# Patient Record
Sex: Female | Born: 1955 | Race: White | Hispanic: No | Marital: Married | Smoking: Former smoker
Health system: Southern US, Community
[De-identification: ages and names within clinical notes are randomized; demographics above are authoritative.]

## PROBLEM LIST (undated history)

## (undated) DIAGNOSIS — Z923 Personal history of irradiation: Secondary | ICD-10-CM

## (undated) DIAGNOSIS — Z803 Family history of malignant neoplasm of breast: Secondary | ICD-10-CM

## (undated) DIAGNOSIS — J189 Pneumonia, unspecified organism: Secondary | ICD-10-CM

## (undated) DIAGNOSIS — I10 Essential (primary) hypertension: Secondary | ICD-10-CM

## (undated) DIAGNOSIS — J45909 Unspecified asthma, uncomplicated: Secondary | ICD-10-CM

## (undated) DIAGNOSIS — C50919 Malignant neoplasm of unspecified site of unspecified female breast: Secondary | ICD-10-CM

## (undated) DIAGNOSIS — N6019 Diffuse cystic mastopathy of unspecified breast: Secondary | ICD-10-CM

## (undated) DIAGNOSIS — K219 Gastro-esophageal reflux disease without esophagitis: Secondary | ICD-10-CM

## (undated) DIAGNOSIS — Z800 Family history of malignant neoplasm of digestive organs: Secondary | ICD-10-CM

## (undated) DIAGNOSIS — E785 Hyperlipidemia, unspecified: Secondary | ICD-10-CM

## (undated) DIAGNOSIS — Z801 Family history of malignant neoplasm of trachea, bronchus and lung: Secondary | ICD-10-CM

## (undated) DIAGNOSIS — M8580 Other specified disorders of bone density and structure, unspecified site: Secondary | ICD-10-CM

## (undated) HISTORY — DX: Family history of malignant neoplasm of digestive organs: Z80.0

## (undated) HISTORY — PX: TONSILLECTOMY: SUR1361

## (undated) HISTORY — PX: BREAST CYST ASPIRATION: SHX578

## (undated) HISTORY — PX: TONSILLECTOMY AND ADENOIDECTOMY: SHX28

## (undated) HISTORY — DX: Family history of malignant neoplasm of trachea, bronchus and lung: Z80.1

## (undated) HISTORY — DX: Family history of malignant neoplasm of breast: Z80.3

## (undated) HISTORY — PX: TUBAL LIGATION: SHX77

---
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
## 2011-06-01 DIAGNOSIS — R739 Hyperglycemia, unspecified: Secondary | ICD-10-CM | POA: Insufficient documentation

## 2011-06-01 DIAGNOSIS — K219 Gastro-esophageal reflux disease without esophagitis: Secondary | ICD-10-CM | POA: Insufficient documentation

## 2011-06-01 DIAGNOSIS — G43009 Migraine without aura, not intractable, without status migrainosus: Secondary | ICD-10-CM | POA: Insufficient documentation

## 2015-03-05 DIAGNOSIS — I10 Essential (primary) hypertension: Secondary | ICD-10-CM | POA: Insufficient documentation

## 2015-04-05 DIAGNOSIS — K760 Fatty (change of) liver, not elsewhere classified: Secondary | ICD-10-CM | POA: Insufficient documentation

## 2015-11-18 ENCOUNTER — Encounter: Payer: Self-pay | Admitting: *Deleted

## 2015-11-18 ENCOUNTER — Ambulatory Visit (INDEPENDENT_AMBULATORY_CARE_PROVIDER_SITE_OTHER): Payer: BLUE CROSS/BLUE SHIELD

## 2015-11-18 ENCOUNTER — Ambulatory Visit
Admission: EM | Admit: 2015-11-18 | Discharge: 2015-11-18 | Disposition: A | Discharge status: Home / Self Care | Payer: BLUE CROSS/BLUE SHIELD | Attending: Family Medicine | Admitting: Family Medicine

## 2015-11-18 DIAGNOSIS — R059 Cough, unspecified: Secondary | ICD-10-CM

## 2015-11-18 DIAGNOSIS — R05 Cough: Secondary | ICD-10-CM

## 2015-11-18 HISTORY — DX: Hyperlipidemia, unspecified: E78.5

## 2015-11-18 HISTORY — DX: Essential (primary) hypertension: I10

## 2015-11-18 HISTORY — DX: Gastro-esophageal reflux disease without esophagitis: K21.9

## 2015-11-18 MED ORDER — IPRATROPIUM-ALBUTEROL 0.5-2.5 (3) MG/3ML IN SOLN
3.0000 mL | Freq: Once | RESPIRATORY_TRACT | Status: AC
  Administered 2015-11-18: 3 mL via RESPIRATORY_TRACT

## 2015-11-18 MED ORDER — FLUTICASONE PROPIONATE 50 MCG/ACT NA SUSP: 2.0000 | Freq: Every day | NASAL | Status: AC

## 2015-11-18 MED ORDER — BENZONATATE 100 MG PO CAPS: 100.0000 mg | ORAL_CAPSULE | Freq: Three times a day (TID) | ORAL | Status: DC | PRN

## 2015-11-18 MED ORDER — ALBUTEROL SULFATE HFA 108 (90 BASE) MCG/ACT IN AERS: 1.0000 | INHALATION_SPRAY | Freq: Four times a day (QID) | RESPIRATORY_TRACT | Status: AC | PRN

## 2015-11-18 NOTE — ED Provider Notes (Signed)
CSN: [id number]     Arrival date & time 11/18/15  1046 History   First MD Initiated Contact with Patient 11/18/15 1306     Chief Complaint  Patient presents with  . Sore Throat  . Cough  . Nasal Congestion   (Consider location/radiation/quality/duration/timing/severity/associated sxs/prior Treatment) HPI 60 yo WF with increasing cough and nasal congestion x 4 days. Uses Zyrtec 1 QD and has continued. Non-smoker. Works at retirement home [employer] and not allowed to go in today reporting morning Temp at 100.1 Wheeze. Small amount mucous production. No hx asthma. Using herbals, elderberry,and tylenol without relief. Remote smoking history  Past Medical History  Diagnosis Date  . Hypertension   . Hyperlipemia   . GERD (gastroesophageal reflux disease)    Past Surgical History  Procedure Laterality Date  . Tubal ligation    . Tonsillectomy and adenoidectomy    . Tonsillectomy     Family History  Problem Relation Age of Onset  . Hypertension Mother   . Cancer Mother   . Diabetes Mother   . Hypertension Father    Social History  Substance Use Topics  . Smoking status: Former Research scientist (life sciences)  . Smokeless tobacco: Never Used  . Alcohol Use: No   OB History    No data available     Review of Systems Constitutional: Low grade fever. No headache. Eyes: No visual changes. [id number] sore throat. Cardiovascular:Negative for chest pain/palpitations Respiratory: Negative for shortness of breath. Positive mild wheeze bilat; [id number] Gastrointestinal: No abdominal pain. No nausea,vomiting, diarrhea Genitourinary: Negative for dysuria. Normal urination. Musculoskeletal: Negative for back pain. FROM extremities without pain Skin: Negative for rash Neurological: Negative for headache, focal weakness or numbness  Allergies  Latex  Home Medications   Prior to Admission medications   Medication Sig Start Date End Date Taking? Authorizing Provider  cetirizine (ZYRTEC) 10 MG tablet Take  10 mg by mouth daily.   Yes Historical Provider, MD  co-enzyme Q-10 50 MG capsule Take 100 mg by mouth daily.   Yes Historical Provider, MD  hydrochlorothiazide (MICROZIDE) 12.5 MG capsule Take 12.5 mg by mouth daily.   Yes Historical Provider, MD  potassium chloride SA (K-DUR,KLOR-CON) 20 MEQ tablet Take 20 mEq by mouth daily.   Yes Historical Provider, MD  ranitidine (ZANTAC) 150 MG tablet Take 150 mg by mouth daily.   Yes Historical Provider, MD  simvastatin (ZOCOR) 40 MG tablet Take 40 mg by mouth daily.   Yes Historical Provider, MD  albuterol (PROVENTIL HFA;VENTOLIN HFA) 108 (90 Base) MCG/ACT inhaler Inhale 1-2 puffs into the lungs every 6 (six) hours as needed for wheezing or shortness of breath. 11/18/15   Jan Fireman, PA-C  benzonatate (TESSALON) 100 MG capsule Take 1 capsule (100 mg total) by mouth 3 (three) times daily as needed. 11/18/15   Jan Fireman, PA-C  fluticasone [facility]) 50 MCG/ACT nasal spray Place 2 sprays into both nostrils daily. 11/18/15   Jan Fireman, PA-C   Meds Ordered and Administered this Visit   Medications  ipratropium-albuterol (DUONEB) 0.5-2.5 (3) MG/3ML nebulizer solution 3 mL (3 mLs Nebulization Given 11/18/15 1319)  eased cough but residual wheeze persisted- add albuterol, increase fluids  BP 108/70 mmHg  Pulse 107  Temp(Src) 98.4 F (36.9 C) (Oral)  Resp 22  Ht 5\' 3"  (1.6 m)  Wt 208 lb (94.348 kg)  BMI 36.85 kg/m2  SpO2 99%  LMP  No data found.   Physical Exam.   VS noted, WNL GENERAL : NAD,  cough repetitive non-productive, afebrile HEENT: no pharyngeal erythema,no exudate, no erythema of TMs, clear canals, no retraction, no cervical LAD RESP: CTA  B , bilateral  wheezing, no accessory muscle use, Wheeze decreased but persists after [id number] 98 CARD: RRR ABD: Not distended NEURO: Good attention, good recall, no gross neuro defecit PSYCH: speech and behavior appropriate  ED Course  Procedures (including critical care time)  Labs  Review Labs Reviewed - No data to display  Imaging Review No results found. No results found for this or any previous visit.  CXR reported as negative for cardio-pulmonary disease Discussed with patient   MDM   1. Cough    Plan: Test/x-ray results and diagnosis reviewed with patient  Treat cough- viral tussive issues reviewed-increase Zyrtec to BID for a few days Rx as per orders;  benefits, risks, potential side effects reviewed   Recommend supportive treatment with cyclic tylenol and ibuprofen, albuterol, tessalon Seek additional medical care if symptoms worsen or are not improving  Discharge Medication List as of 11/18/2015  2:13 PM    START taking these medications   Details  albuterol (PROVENTIL HFA;VENTOLIN HFA) 108 (90 Base) MCG/ACT inhaler Inhale 1-2 puffs into the lungs every 6 (six) hours as needed for wheezing or shortness of breath., Starting 11/18/2015, Until Discontinued, Normal    benzonatate (TESSALON) 100 MG capsule Take 1 capsule (100 mg total) by mouth 3 (three) times daily as needed., Starting 11/18/2015, Until Discontinued, Normal    fluticasone (FLONASE) 50 MCG/ACT nasal spray Place 2 sprays into both nostrils daily., Starting 11/18/2015, Until Discontinued, Normal           Jan Fireman, PA-C 11/22/15 2220

## 2015-11-18 NOTE — ED Notes (Signed)
Patient started having symptoms of sore throat and cough last Thursday which progressed to nasal congestion and drainage on Friday. Symptoms have progressively worsened with fever symptoms occuring today. Patient has a history of pneumonia.

## 2015-11-18 NOTE — Discharge Instructions (Signed)
Add back Zyrtec 10  Mg 1 twice daily for a few days, the one a day Benzonatate for cough as dorected Inhaler as discusses for cough Fluticasone nasal spray 1 per nostril twice daily for 3 days then reduce to daily   [employer] may help relieve the symptoms of a cough and cold. They add moisture to the air, which helps mucus to become thinner and less sticky. This makes it easier to breathe and cough up secretions. Cool mist vaporizers do not cause serious burns like hot mist vaporizers, which may also be called steamers or humidifiers. Vaporizers have not been proven to help with colds. You should not use a vaporizer if you are allergic to mold. HOME CARE INSTRUCTIONS  Follow the package instructions for the vaporizer.  Do not use anything other than distilled water in the vaporizer.  Do not run the vaporizer all of the time. This can cause mold or bacteria to grow in the vaporizer.  Clean the vaporizer after each time it is used.  Clean and dry the vaporizer well before storing it.  Stop using the vaporizer if worsening respiratory symptoms develop.   This information is not intended to replace advice given to you by your health care provider. Make sure you discuss any questions you have with your health care provider.   Document Released: 07/16/2004 Document Revised: 10/24/2013 Document Reviewed: 03/08/2013 Elsevier Interactive Patient Education 2016 Elsevier Inc.  Cough, Adult A cough helps to clear your throat and lungs. A cough may last only 2-3 weeks (acute), or it may last longer than 8 weeks (chronic). Many different things can cause a cough. A cough may be a sign of an illness or another medical condition. HOME CARE  Pay attention to any changes in your cough.  Take medicines only as told by your doctor.  If you were prescribed an antibiotic medicine, take it as told by your doctor. Do not stop taking it even if you start to feel better.  Talk with  your doctor before you try using a cough medicine.  Drink enough fluid to keep your pee (urine) clear or pale yellow.  If the air is dry, use a cold steam vaporizer or humidifier in your home.  Stay away from things that make you cough at work or at home.  If your cough is worse at night, try using extra pillows to raise your head up higher while you sleep.  Do not smoke, and try not to be around smoke. If you need help quitting, ask your doctor.  Do not have caffeine.  Do not drink alcohol.  Rest as needed. GET HELP IF:  You have new problems (symptoms).  You cough up yellow fluid (pus).  Your cough does not get better after 2-3 weeks, or your cough gets worse.  Medicine does not help your cough and you are not sleeping well.  You have pain that gets worse or pain that is not helped with medicine.  You have a fever.  You are losing weight and you do not know why.  You have night sweats. GET HELP RIGHT AWAY IF:  You cough up blood.  You have trouble breathing.  Your heartbeat is very fast.   This information is not intended to replace advice given to you by your health care provider. Make sure you discuss any questions you have with your health care provider.   Document Released: 07/02/2011 Document Revised: 07/10/2015 Document Reviewed: 12/26/2014 Elsevier Interactive Patient Education  Education ©2016 Elsevier Inc. ° °

## 2015-11-22 ENCOUNTER — Encounter: Payer: Self-pay | Admitting: Physician Assistant

## 2016-01-13 DIAGNOSIS — R05 Cough: Secondary | ICD-10-CM | POA: Insufficient documentation

## 2016-01-13 DIAGNOSIS — J302 Other seasonal allergic rhinitis: Secondary | ICD-10-CM | POA: Insufficient documentation

## 2016-01-13 DIAGNOSIS — R053 Chronic cough: Secondary | ICD-10-CM | POA: Insufficient documentation

## 2016-01-13 DIAGNOSIS — H1013 Acute atopic conjunctivitis, bilateral: Secondary | ICD-10-CM | POA: Insufficient documentation

## 2017-03-15 DIAGNOSIS — E7800 Pure hypercholesterolemia, unspecified: Secondary | ICD-10-CM | POA: Insufficient documentation

## 2019-04-11 ENCOUNTER — Other Ambulatory Visit: Payer: Self-pay | Admitting: Family Medicine

## 2019-04-11 DIAGNOSIS — Z1231 Encounter for screening mammogram for malignant neoplasm of breast: Secondary | ICD-10-CM

## 2019-04-19 ENCOUNTER — Ambulatory Visit
Admission: RE | Admit: 2019-04-19 | Discharge: 2019-04-19 | Disposition: A | Discharge status: Home / Self Care | Payer: BC Managed Care – PPO | Source: Ambulatory Visit | Attending: Family Medicine | Admitting: Family Medicine

## 2019-04-19 ENCOUNTER — Encounter (INDEPENDENT_AMBULATORY_CARE_PROVIDER_SITE_OTHER): Payer: Self-pay

## 2019-04-19 ENCOUNTER — Other Ambulatory Visit: Payer: Self-pay

## 2019-04-19 DIAGNOSIS — Z1231 Encounter for screening mammogram for malignant neoplasm of breast: Secondary | ICD-10-CM | POA: Insufficient documentation

## 2019-04-19 HISTORY — DX: Diffuse cystic mastopathy of unspecified breast: N60.19

## 2019-04-24 ENCOUNTER — Other Ambulatory Visit: Payer: Self-pay | Admitting: Family Medicine

## 2019-04-24 DIAGNOSIS — R921 Mammographic calcification found on diagnostic imaging of breast: Secondary | ICD-10-CM

## 2019-04-24 DIAGNOSIS — R928 Other abnormal and inconclusive findings on diagnostic imaging of breast: Secondary | ICD-10-CM

## 2019-04-28 ENCOUNTER — Ambulatory Visit
Admission: RE | Admit: 2019-04-28 | Discharge: 2019-04-28 | Disposition: A | Discharge status: Home / Self Care | Payer: BC Managed Care – PPO | Source: Ambulatory Visit | Attending: Family Medicine | Admitting: Family Medicine

## 2019-04-28 ENCOUNTER — Other Ambulatory Visit: Payer: Self-pay | Admitting: Family Medicine

## 2019-04-28 ENCOUNTER — Other Ambulatory Visit: Payer: Self-pay

## 2019-04-28 DIAGNOSIS — R928 Other abnormal and inconclusive findings on diagnostic imaging of breast: Secondary | ICD-10-CM

## 2019-04-28 DIAGNOSIS — R921 Mammographic calcification found on diagnostic imaging of breast: Secondary | ICD-10-CM

## 2019-05-01 ENCOUNTER — Other Ambulatory Visit: Payer: Self-pay | Admitting: Family Medicine

## 2019-05-01 DIAGNOSIS — R921 Mammographic calcification found on diagnostic imaging of breast: Secondary | ICD-10-CM

## 2019-05-01 DIAGNOSIS — R928 Other abnormal and inconclusive findings on diagnostic imaging of breast: Secondary | ICD-10-CM

## 2019-05-02 ENCOUNTER — Ambulatory Visit
Admission: RE | Admit: 2019-05-02 | Discharge: 2019-05-02 | Disposition: A | Discharge status: Home / Self Care | Payer: BC Managed Care – PPO | Source: Ambulatory Visit | Attending: Family Medicine | Admitting: Family Medicine

## 2019-05-02 ENCOUNTER — Other Ambulatory Visit: Payer: Self-pay

## 2019-05-02 DIAGNOSIS — R921 Mammographic calcification found on diagnostic imaging of breast: Secondary | ICD-10-CM | POA: Diagnosis present

## 2019-05-02 DIAGNOSIS — R928 Other abnormal and inconclusive findings on diagnostic imaging of breast: Secondary | ICD-10-CM

## 2019-05-02 HISTORY — PX: BREAST BIOPSY: SHX20

## 2019-05-04 ENCOUNTER — Encounter: Payer: Self-pay | Admitting: *Deleted

## 2019-05-04 DIAGNOSIS — D0511 Intraductal carcinoma in situ of right breast: Secondary | ICD-10-CM

## 2019-05-04 NOTE — Progress Notes (Signed)
Called patient today to establish navigation services.  Patient is newly diagnosed with DCIS of the right breast.  She was notified of her diagnosis by Ermalene Searing at [facility] radiology and informed navigation would assist with coordination of care.  Patient is affilated with [facility] through her primary care provider Dr Hoy Morn, but would like to have her surgery and treatment close to home.  I have scheduled her to see Dr. Peyton Najjar at [facility] and Dr. Tasia Catchings at the cancer center.  I left patient a message with her appointment and requested she return my call to confirm.  She was encouraged to call with any questions of needs.

## 2019-05-08 ENCOUNTER — Other Ambulatory Visit: Payer: Self-pay | Admitting: Pathology

## 2019-05-08 LAB — SURGICAL PATHOLOGY

## 2019-05-09 ENCOUNTER — Ambulatory Visit: Payer: Self-pay | Admitting: General Surgery

## 2019-05-09 NOTE — H&P (Signed)
PATIENT PROFILE: Jane Gardner is a 63 y.o. female who presents to the Clinic for consultation at the request of Dr. Hoy Gardner for evaluation of Breast Cancer.  PCP:  Jane Chyle, MD  HISTORY OF PRESENT ILLNESS: Jane Gardner reports he had her screening mammogram and she was found with a suspicious lesion.  She underwent diagnostic mammogram and ultrasound which shows a linear calcifications.  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  Patient denied any breast mass, any skin changes or any nipple retraction or discharge.  Family history of breast cancer: Mother with breast cancer Family history of other cancers: Aunt with pancreatic cancer, uncle with lung cancer. Menarche: 47 years old Menopause: In her 22s Used OCP: 14 years Used estrogen and progesterone therapy: Premarin History of Radiation to the chest: None No previous breast biopsy.  PROBLEM LIST:         Problem List  Date Reviewed: 12/09/2017         Noted   Hypercholesterolemia 03/15/2017   Seasonal allergic rhinitis 01/13/2016   Allergic conjunctivitis of both eyes 01/13/2016   Chronic cough 01/13/2016   Fatty liver: has completed Hep A and B series 04/05/2015   Benign essential hypertension 03/05/2015   Migraine without aura and without status migrainosus, not intractable 06/01/2011   GERD (gastroesophageal reflux disease) 06/01/2011   Hyperglycemia 06/01/2011      GENERAL REVIEW OF SYSTEMS:   General ROS: negative for - chills, fatigue, fever, weight gain or weight loss Allergy and Immunology ROS: negative for - hives  Hematological and Lymphatic ROS: negative for - bleeding problems or bruising, negative for palpable nodes Endocrine ROS: negative for - heat or cold intolerance, hair changes Respiratory ROS: negative for - cough, shortness of breath or wheezing Cardiovascular ROS: no chest pain or palpitations GI ROS: negative for nausea,  vomiting, abdominal pain, diarrhea, constipation Musculoskeletal ROS: negative for - joint swelling or muscle pain Neurological ROS: negative for - confusion, syncope Dermatological ROS: negative for pruritus and rash Psychiatric: negative for anxiety, depression, difficulty sleeping and memory loss  MEDICATIONS: CurrentMedications        Current Outpatient Medications  Medication Sig Dispense Refill  . ascorbic acid, vitamin C, (VITAMIN C) 1000 MG tablet Take 100 mg by mouth once daily       . ascorbic acid/collagen hydr (COLLAGEN PLUS VITAMIN C ORAL) Take 2,500 mg by mouth 3 (three) times daily    . atorvastatin (LIPITOR) 40 MG tablet Take 1 tablet (40 mg total) by mouth once daily 90 tablet 3  . BIOTIN ORAL Take by mouth.    . cetirizine (ZYRTEC) 10 mg capsule 1 tab by mouth daily    . cholecalciferol (VITAMIN D3) 1,000 unit capsule Take 1,000 Units by mouth every other day      . coenzyme Q10 10 mg capsule Take 100 mg by mouth once daily.      Marland Kitchen ELDERBERRY FRUIT ORAL Take by mouth w/ zinc & vitamins - 2 daily    . famotidine (PEPCID) 20 MG tablet Take 20 mg by mouth 2 (two) times daily    . fluticasone (FLONASE) 50 mcg/actuation nasal spray 2 sprays by Nasal route once daily       . glucosamine/msm/csa/cal/hrb115 (GLUCOSAMIN-CHOND-MSM-CAL-115HC ORAL) Take by mouth    . lisinopriL (ZESTRIL) 10 MG tablet Take 1 tablet (10 mg total) by mouth once daily 90 tablet 4  . potassium chloride (KLOR-CON) 10 MEQ ER  tablet Take 2 tablets (20 mEq total) by mouth once daily 180 tablet 3   No current facility-administered medications for this visit.       ALLERGIES: Latex and Levsin [hyoscyamine sulfate]  PAST MEDICAL HISTORY:     Past Medical History:  Diagnosis Date  . Allergic conjunctivitis of both eyes 01/13/2016  . Arthritis 2018   right knee  . Chronic urticaria    secondary to latex/elastic exposure  . Depression 1987  . Dyspepsia   . Fatty liver  - Hep B completed.  1st Hep A 04/05/15 04/05/2015  . GERD (gastroesophageal reflux disease)   . Hypercholesterolemia 03/15/2017  . Hyperglycemia   . Hyperlipidemia   . Hypertension 10.20.09  . Migraines    two per month  . Morbid obesity (CMS-HCC)   . Plantar fasciitis   . Seasonal allergic rhinitis 01/13/2016  . Uterine fibroid july 2000   vs adenomyosis on ultrasound,endometrial biopsy negative    PAST SURGICAL HISTORY:      Past Surgical History:  Procedure Laterality Date  . TONSILLECTOMY    . TUBAL LIGATION  1984     FAMILY HISTORY:      Family History  Problem Relation Age of Onset  . High blood pressure (Hypertension) Mother   . Breast cancer Mother   . Hyperlipidemia (Elevated cholesterol) Mother   . Diabetes type II Father   . Deep vein thrombosis (DVT or abnormal blood clot formation) Father   . Diabetes Father   . Hyperlipidemia (Elevated cholesterol) Sister   . Stroke Maternal Grandmother   . Diabetes type II Paternal Grandmother   . Diabetes Paternal Grandmother   . Obesity Son   . High blood pressure (Hypertension) Son   . Asthma Son   . Obesity Son   . No Known Problems Daughter   . Alcohol abuse Paternal Grandfather      SOCIAL HISTORY: Social History          Socioeconomic History  . Marital status: Married    Spouse name: Not on file  . Number of children: Not on file  . Years of education: Not on file  . Highest education level: Not on file  Occupational History  . Not on file  Social Needs  . Financial resource strain: Not on file  . Food insecurity:    Worry: Not on file    Inability: Not on file  . Transportation needs:    Medical: Not on file    Non-medical: Not on file  Tobacco Use  . Smoking status: Former Smoker    Packs/day: 2.00    Years: 3.00    Pack years: 6.00    Types: Cigarettes    Last attempt to quit: 11/03/1975    Years since quitting: 43.5  . Smokeless tobacco:  Never Used  Substance and Sexual Activity  . Alcohol use: Yes    Alcohol/week: 0.0 standard drinks    Comment: 1 glass wine 1-2 times a month.  . Drug use: No  . Sexual activity: Yes    Partners: Male    Birth control/protection: Surgical  Other Topics Concern  . Not on file  Social History Narrative   She lives with her husband, married 02/17/1975, three grown children. 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   Exercise: active at work.    Wears seatbelt, sunscreen.  Dentist: yes.  Calcium in diet: yes.  No religious beliefs affecting  health care.        Advanced Directives: yes.  Has a living will and HCPOA.        PHYSICAL EXAM:    Vitals:   05/09/19 1528  BP: 111/74  Pulse: 84   Body mass index is 38.97 kg/m. Weight: 99.8 kg (220 lb)   GENERAL: Alert, active, oriented x3  HEENT: Pupils equal reactive to light. Extraocular movements are intact. Sclera clear. Palpebral conjunctiva normal red color.Pharynx clear.  NECK: Supple with no palpable mass and no adenopathy.  LUNGS: Sound clear with no rales rhonchi or wheezes.  HEART: Regular rhythm S1 and S2 without murmur.  BREAST: Both breasts are examined in the supine and sitting position.  There was no palpable mass, no skin changes, no nipple retraction.  There was no axillary adenopathy bilaterally.  ABDOMEN: Soft and depressible, nontender with no palpable mass, no hepatomegaly.  EXTREMITIES: Well-developed well-nourished symmetrical with no dependent edema.  NEUROLOGICAL: Awake alert oriented, facial expression symmetrical, moving all extremities.  REVIEW OF DATA: I have reviewed the following data today:      Office Visit on 04/06/2019  Component Date Value  . Sodium 04/20/2019 143   . Potassium 04/20/2019 4.3   . Chloride 04/20/2019 111*  . Carbon Dioxide (CO2) 04/20/2019 25   . Urea Nitrogen (BUN) 04/20/2019 16   . Creatinine  04/20/2019 0.9   . Glucose 04/20/2019 91   . Calcium 04/20/2019 8.8   . AST (Aspartate Aminotran* 04/20/2019 23   . ALT (Alanine Aminotransf* 04/20/2019 25   . Bilirubin, Total 04/20/2019 0.6   . Alk Phos (Alkaline Phosp* 04/20/2019 78   . Albumin 04/20/2019 3.6   . Protein, Total 04/20/2019 6.3   . Anion Gap 04/20/2019 7   . BUN/CREA Ratio 04/20/2019 18   . Glomerular Filtration Ra* 04/20/2019 67   . Cholesterol, Total 04/20/2019 137   . LDL Calculated 04/20/2019 69   . HDL 04/20/2019 42   . Triglyceride 04/20/2019 128   . Hemoglobin A1C 04/06/2019 5.6   . Average Blood Glucose (C* 04/06/2019 111      ASSESSMENT: Jane Gardner is a 63 y.o. female presenting for consultation for right breast ductal carcinoma in situ.    Patient was oriented again about the pathology results. Surgical alternatives were discussed with patient including partial vs total mastectomy. Surgical technique and post operative care was discussed with patient. Risk of surgery was discussed with patient including but not limited to: wound infection, seroma, hematoma, brachial plexopathy, mondor's disease (thrombosis of small veins of breast), chronic wound pain, breast lymphedema, altered sensation to the nipple and cosmesis among others.   Patient has DCIS with comedonecrosis.  Slight increase in upgrading the DCIS to invasive breast cancer.  Discussed with her the alternative of doing a sentinel node biopsy on the surgery also because the lesion is in the upper outer quadrant that can make doing a sentinel needle biopsy of the right axilla later if she comes back positive for invasive carcinoma.  In that case she will need a axillary dissection.  With the recommendation patient prefers to have the sentinel node biopsy at this moment.  Malignant neoplasm of upper-outer quadrant of right breast in female, estrogen receptor positive (CMS-HCC) [C50.411, Z17.0]  PLAN: 1.  Needle guided partial mastectomy with  sentinel node biopsy of the right breast (19301, 38525) 2.  Avoid taking aspirin 5 days before surgery 3.  Contact office if you have any concern or question.  Patient verbalized  understanding, all questions were answered, and were agreeable with the plan outlined above.     Herbert Pun, MD  Electronically signed by Herbert Pun, MD

## 2019-05-09 NOTE — H&P (View-Only) (Signed)
PATIENT PROFILE: Tatisha Cerino is a 63 y.o. female who presents to the Clinic for consultation at the request of Dr. Hoy Morn for evaluation of Breast Cancer.  PCP:  Dimas Chyle, MD  HISTORY OF PRESENT ILLNESS: Ms. Koffler reports he had her screening mammogram and she was found with a suspicious lesion.  She underwent diagnostic mammogram and ultrasound which shows a linear calcifications.  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  Patient denied any breast mass, any skin changes or any nipple retraction or discharge.  Family history of breast cancer: Mother with breast cancer Family history of other cancers: Aunt with pancreatic cancer, uncle with lung cancer. Menarche: 45 years old Menopause: In her 64s Used OCP: 14 years Used estrogen and progesterone therapy: Premarin History of Radiation to the chest: None No previous breast biopsy.  PROBLEM LIST:         Problem List  Date Reviewed: 12/09/2017         Noted   Hypercholesterolemia 03/15/2017   Seasonal allergic rhinitis 01/13/2016   Allergic conjunctivitis of both eyes 01/13/2016   Chronic cough 01/13/2016   Fatty liver: has completed Hep A and B series 04/05/2015   Benign essential hypertension 03/05/2015   Migraine without aura and without status migrainosus, not intractable 06/01/2011   GERD (gastroesophageal reflux disease) 06/01/2011   Hyperglycemia 06/01/2011      GENERAL REVIEW OF SYSTEMS:   General ROS: negative for - chills, fatigue, fever, weight gain or weight loss Allergy and Immunology ROS: negative for - hives  Hematological and Lymphatic ROS: negative for - bleeding problems or bruising, negative for palpable nodes Endocrine ROS: negative for - heat or cold intolerance, hair changes Respiratory ROS: negative for - cough, shortness of breath or wheezing Cardiovascular ROS: no chest pain or palpitations GI ROS: negative for nausea,  vomiting, abdominal pain, diarrhea, constipation Musculoskeletal ROS: negative for - joint swelling or muscle pain Neurological ROS: negative for - confusion, syncope Dermatological ROS: negative for pruritus and rash Psychiatric: negative for anxiety, depression, difficulty sleeping and memory loss  MEDICATIONS: CurrentMedications        Current Outpatient Medications  Medication Sig Dispense Refill  . ascorbic acid, vitamin C, (VITAMIN C) 1000 MG tablet Take 100 mg by mouth once daily       . ascorbic acid/collagen hydr (COLLAGEN PLUS VITAMIN C ORAL) Take 2,500 mg by mouth 3 (three) times daily    . atorvastatin (LIPITOR) 40 MG tablet Take 1 tablet (40 mg total) by mouth once daily 90 tablet 3  . BIOTIN ORAL Take by mouth.    . cetirizine (ZYRTEC) 10 mg capsule 1 tab by mouth daily    . cholecalciferol (VITAMIN D3) 1,000 unit capsule Take 1,000 Units by mouth every other day      . coenzyme Q10 10 mg capsule Take 100 mg by mouth once daily.      Marland Kitchen ELDERBERRY FRUIT ORAL Take by mouth w/ zinc & vitamins - 2 daily    . famotidine (PEPCID) 20 MG tablet Take 20 mg by mouth 2 (two) times daily    . fluticasone (FLONASE) 50 mcg/actuation nasal spray 2 sprays by Nasal route once daily       . glucosamine/msm/csa/cal/hrb115 (GLUCOSAMIN-CHOND-MSM-CAL-115HC ORAL) Take by mouth    . lisinopriL (ZESTRIL) 10 MG tablet Take 1 tablet (10 mg total) by mouth once daily 90 tablet 4  . potassium chloride (KLOR-CON) 10 MEQ ER  tablet Take 2 tablets (20 mEq total) by mouth once daily 180 tablet 3   No current facility-administered medications for this visit.       ALLERGIES: Latex and Levsin [hyoscyamine sulfate]  PAST MEDICAL HISTORY:     Past Medical History:  Diagnosis Date  . Allergic conjunctivitis of both eyes 01/13/2016  . Arthritis 2018   right knee  . Chronic urticaria    secondary to latex/elastic exposure  . Depression 1987  . Dyspepsia   . Fatty liver  - Hep B completed.  1st Hep A 04/05/15 04/05/2015  . GERD (gastroesophageal reflux disease)   . Hypercholesterolemia 03/15/2017  . Hyperglycemia   . Hyperlipidemia   . Hypertension 10.20.09  . Migraines    two per month  . Morbid obesity (CMS-HCC)   . Plantar fasciitis   . Seasonal allergic rhinitis 01/13/2016  . Uterine fibroid july 2000   vs adenomyosis on ultrasound,endometrial biopsy negative    PAST SURGICAL HISTORY:      Past Surgical History:  Procedure Laterality Date  . TONSILLECTOMY    . TUBAL LIGATION  1984     FAMILY HISTORY:      Family History  Problem Relation Age of Onset  . High blood pressure (Hypertension) Mother   . Breast cancer Mother   . Hyperlipidemia (Elevated cholesterol) Mother   . Diabetes type II Father   . Deep vein thrombosis (DVT or abnormal blood clot formation) Father   . Diabetes Father   . Hyperlipidemia (Elevated cholesterol) Sister   . Stroke Maternal Grandmother   . Diabetes type II Paternal Grandmother   . Diabetes Paternal Grandmother   . Obesity Son   . High blood pressure (Hypertension) Son   . Asthma Son   . Obesity Son   . No Known Problems Daughter   . Alcohol abuse Paternal Grandfather      SOCIAL HISTORY: Social History          Socioeconomic History  . Marital status: Married    Spouse name: Not on file  . Number of children: Not on file  . Years of education: Not on file  . Highest education level: Not on file  Occupational History  . Not on file  Social Needs  . Financial resource strain: Not on file  . Food insecurity:    Worry: Not on file    Inability: Not on file  . Transportation needs:    Medical: Not on file    Non-medical: Not on file  Tobacco Use  . Smoking status: Former Smoker    Packs/day: 2.00    Years: 3.00    Pack years: 6.00    Types: Cigarettes    Last attempt to quit: 11/03/1975    Years since quitting: 43.5  . Smokeless tobacco:  Never Used  Substance and Sexual Activity  . Alcohol use: Yes    Alcohol/week: 0.0 standard drinks    Comment: 1 glass wine 1-2 times a month.  . Drug use: No  . Sexual activity: Yes    Partners: Male    Birth control/protection: Surgical  Other Topics Concern  . Not on file  Social History Narrative   She lives with her husband, married 02/17/1975, three grown children. 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   Exercise: active at work.    Wears seatbelt, sunscreen.  Dentist: yes.  Calcium in diet: yes.  No religious beliefs affecting  health care.        Advanced Directives: yes.  Has a living will and HCPOA.        PHYSICAL EXAM:    Vitals:   05/09/19 1528  BP: 111/74  Pulse: 84   Body mass index is 38.97 kg/m. Weight: 99.8 kg (220 lb)   GENERAL: Alert, active, oriented x3  HEENT: Pupils equal reactive to light. Extraocular movements are intact. Sclera clear. Palpebral conjunctiva normal red color.Pharynx clear.  NECK: Supple with no palpable mass and no adenopathy.  LUNGS: Sound clear with no rales rhonchi or wheezes.  HEART: Regular rhythm S1 and S2 without murmur.  BREAST: Both breasts are examined in the supine and sitting position.  There was no palpable mass, no skin changes, no nipple retraction.  There was no axillary adenopathy bilaterally.  ABDOMEN: Soft and depressible, nontender with no palpable mass, no hepatomegaly.  EXTREMITIES: Well-developed well-nourished symmetrical with no dependent edema.  NEUROLOGICAL: Awake alert oriented, facial expression symmetrical, moving all extremities.  REVIEW OF DATA: I have reviewed the following data today:      Office Visit on 04/06/2019  Component Date Value  . Sodium 04/20/2019 143   . Potassium 04/20/2019 4.3   . Chloride 04/20/2019 111*  . Carbon Dioxide (CO2) 04/20/2019 25   . Urea Nitrogen (BUN) 04/20/2019 16   . Creatinine  04/20/2019 0.9   . Glucose 04/20/2019 91   . Calcium 04/20/2019 8.8   . AST (Aspartate Aminotran* 04/20/2019 23   . ALT (Alanine Aminotransf* 04/20/2019 25   . Bilirubin, Total 04/20/2019 0.6   . Alk Phos (Alkaline Phosp* 04/20/2019 78   . Albumin 04/20/2019 3.6   . Protein, Total 04/20/2019 6.3   . Anion Gap 04/20/2019 7   . BUN/CREA Ratio 04/20/2019 18   . Glomerular Filtration Ra* 04/20/2019 67   . Cholesterol, Total 04/20/2019 137   . LDL Calculated 04/20/2019 69   . HDL 04/20/2019 42   . Triglyceride 04/20/2019 128   . Hemoglobin A1C 04/06/2019 5.6   . Average Blood Glucose (C* 04/06/2019 111      ASSESSMENT: Ms. Deguzman is a 63 y.o. female presenting for consultation for right breast ductal carcinoma in situ.    Patient was oriented again about the pathology results. Surgical alternatives were discussed with patient including partial vs total mastectomy. Surgical technique and post operative care was discussed with patient. Risk of surgery was discussed with patient including but not limited to: wound infection, seroma, hematoma, brachial plexopathy, mondor's disease (thrombosis of small veins of breast), chronic wound pain, breast lymphedema, altered sensation to the nipple and cosmesis among others.   Patient has DCIS with comedonecrosis.  Slight increase in upgrading the DCIS to invasive breast cancer.  Discussed with her the alternative of doing a sentinel node biopsy on the surgery also because the lesion is in the upper outer quadrant that can make doing a sentinel needle biopsy of the right axilla later if she comes back positive for invasive carcinoma.  In that case she will need a axillary dissection.  With the recommendation patient prefers to have the sentinel node biopsy at this moment.  Malignant neoplasm of upper-outer quadrant of right breast in female, estrogen receptor positive (CMS-HCC) [C50.411, Z17.0]  PLAN: 1.  Needle guided partial mastectomy with  sentinel node biopsy of the right breast (19301, 38525) 2.  Avoid taking aspirin 5 days before surgery 3.  Contact office if you have any concern or question.  Patient verbalized  understanding, all questions were answered, and were agreeable with the plan outlined above.     Herbert Pun, MD  Electronically signed by Herbert Pun, MD

## 2019-05-10 ENCOUNTER — Encounter: Payer: Self-pay | Admitting: Oncology

## 2019-05-10 ENCOUNTER — Other Ambulatory Visit: Payer: Self-pay

## 2019-05-10 ENCOUNTER — Inpatient Hospital Stay: Payer: BC Managed Care – PPO | Attending: Oncology | Admitting: Oncology

## 2019-05-10 ENCOUNTER — Other Ambulatory Visit: Payer: Self-pay | Admitting: General Surgery

## 2019-05-10 VITALS — BP 115/82 | HR 73 | Temp 96.3°F | Resp 18 | Ht 63.0 in | Wt 223.2 lb

## 2019-05-10 DIAGNOSIS — Z170 Estrogen receptor positive status [ER+]: Secondary | ICD-10-CM | POA: Diagnosis not present

## 2019-05-10 DIAGNOSIS — Z803 Family history of malignant neoplasm of breast: Secondary | ICD-10-CM | POA: Diagnosis not present

## 2019-05-10 DIAGNOSIS — Z809 Family history of malignant neoplasm, unspecified: Secondary | ICD-10-CM

## 2019-05-10 DIAGNOSIS — C50411 Malignant neoplasm of upper-outer quadrant of right female breast: Secondary | ICD-10-CM

## 2019-05-10 DIAGNOSIS — D0511 Intraductal carcinoma in situ of right breast: Secondary | ICD-10-CM | POA: Diagnosis not present

## 2019-05-10 DIAGNOSIS — Z800 Family history of malignant neoplasm of digestive organs: Secondary | ICD-10-CM | POA: Diagnosis not present

## 2019-05-10 NOTE — Progress Notes (Signed)
Patient here to establish care. Pt had breast biopsy last week and will be having breast surgery on 7/13.

## 2019-05-11 ENCOUNTER — Other Ambulatory Visit: Payer: Self-pay

## 2019-05-11 ENCOUNTER — Encounter
Admission: RE | Admit: 2019-05-11 | Discharge: 2019-05-11 | Disposition: A | Discharge status: Home / Self Care | Payer: BC Managed Care – PPO | Source: Ambulatory Visit | Attending: General Surgery | Admitting: General Surgery

## 2019-05-11 ENCOUNTER — Other Ambulatory Visit: Admission: RE | Admit: 2019-05-11 | Payer: BC Managed Care – PPO | Source: Ambulatory Visit

## 2019-05-11 ENCOUNTER — Encounter: Payer: Self-pay | Admitting: *Deleted

## 2019-05-11 DIAGNOSIS — Z01818 Encounter for other preprocedural examination: Secondary | ICD-10-CM | POA: Insufficient documentation

## 2019-05-11 DIAGNOSIS — R001 Bradycardia, unspecified: Secondary | ICD-10-CM | POA: Insufficient documentation

## 2019-05-11 DIAGNOSIS — I10 Essential (primary) hypertension: Secondary | ICD-10-CM | POA: Diagnosis not present

## 2019-05-11 DIAGNOSIS — Z87891 Personal history of nicotine dependence: Secondary | ICD-10-CM | POA: Insufficient documentation

## 2019-05-11 DIAGNOSIS — Z1159 Encounter for screening for other viral diseases: Secondary | ICD-10-CM | POA: Insufficient documentation

## 2019-05-11 HISTORY — DX: Unspecified asthma, uncomplicated: J45.909

## 2019-05-11 HISTORY — DX: Pneumonia, unspecified organism: J18.9

## 2019-05-11 NOTE — Patient Instructions (Signed)
Your procedure is scheduled on: Mon 7/13 Report to Radiology. 9:45   Remember: Instructions that are not followed completely may result in serious medical risk,  up to and including death, or upon the discretion of your surgeon and anesthesiologist your  surgery may need to be rescheduled.     _X__ 1. Do not eat food after midnight the night before your procedure.                 No gum chewing or hard candies. You may drink clear liquids up to 2 hours                 before you are scheduled to arrive for your surgery- DO not drink clear                 liquids within 2 hours of the start of your surgery.                 Clear Liquids include:  water, apple juice without pulp, clear carbohydrate                 drink such as Clearfast of Gatorade, Black Coffee or Tea (Do not add                 anything to coffee or tea).  __X__2.  On the morning of surgery brush your teeth with toothpaste and water, you                may rinse your mouth with mouthwash if you wish.  Do not swallow any toothpaste of mouthwash.     _X__ 3.  No Alcohol for 24 hours before or after surgery.   ___ 4.  Do Not Smoke or use e-cigarettes For 24 Hours Prior to Your Surgery.                 Do not use any chewable tobacco products for at least 6 hours prior to                 surgery.  ____  5.  Bring all medications with you on the day of surgery if instructed.   __x__  6.  Notify your doctor if there is any change in your medical condition      (cold, fever, infections).     Do not wear jewelry, make-up, hairpins, clips or nail polish. Do not wear lotions, powders, or perfumes. You may wear deodorant. Do not shave 48 hours prior to surgery. Men may shave face and neck. Do not bring valuables to the hospital.    [facility] is not responsible for any belongings or valuables.  Contacts, dentures or bridgework may not be worn into surgery. Leave your suitcase in the car. After  surgery it may be brought to your room. For patients admitted to the hospital, discharge time is determined by your treatment team.   Patients discharged the day of surgery will not be allowed to drive home.   Please read over the following fact sheets that you were given:    __x__ Take these medicines the morning of surgery with A SIP OF WATER:    1. famotidine (PEPCID) 20 MG tablet  2. fluticasone (FLONASE) 50 MCG/ACT nasal spray  3. cetirizine (ZYRTEC) 10 MG tablet  4.  5.  6.  ____ Fleet Enema (as directed)   __x__ Use CHG Soap as directed  ____ Use inhalers on the day of surgery  ____  Stop metformin 2 days prior to surgery    ____ Take 1/2 of usual insulin dose the night before surgery. No insulin the morning          of surgery.   ____ Stop Coumadin/Plavix/aspirin on   __x__ Stop Anti-inflammatories ibuprofen aleve until after sugery.  May take tylenol    __x__ Stop supplements until after surgery.  Ascorbic Acid (VITAMIN C) 1000 MG tablet,Biotin 5000 MCG CAPS,co-enzyme Q-10 50 MG capsule,ELDERBERRY PO,Misc Natural Products (GLUCOSAMINE CHONDROITIN ADV PO)  ____ Bring C-Pap to the hospital.

## 2019-05-11 NOTE — Progress Notes (Signed)
Patient returned my call.  States she is scheduled for surgery on Monday.  States her appointment with Dr. Tasia Catchings went well yesterday.  No questions.  She has an appointment to follow up with her after surgery.  I will FedEx her patient breast cancer educational literature, "My Breast Cancer Treatment Handbook" by Josephine Igo, RN.  Patient is to call with any questions or needs.

## 2019-05-12 LAB — SARS CORONAVIRUS 2 (TAT 6-24 HRS): SARS Coronavirus 2: NEGATIVE

## 2019-05-12 NOTE — Progress Notes (Signed)
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  Estrogen receptor status, >90%  Nipple discharge: Denies Family history: Mother had history of breast cancer and bladder cancer.  Alive.  Maternal aunt has pancreatic cancer.  Maternal uncle has stomach cancer.  Maternal uncle has lung cancer. OCP use: Previous use of OCP Estrogen and progesterone therapy: Menopause in 69s, she was on hormone replacement for a few years. History of radiation to chest: denies.  No previous breast biopsy.   Review of Systems  Constitutional: Negative for appetite change, chills, fatigue and fever.  HENT:   Negative for hearing loss and voice change.   Eyes: Negative for eye problems.  Respiratory: Negative for chest tightness and cough.   Cardiovascular: Negative for chest pain.  Gastrointestinal: Negative for abdominal distention, abdominal pain and blood in stool.  Endocrine: Negative for hot flashes.  Genitourinary: Negative for difficulty urinating and frequency.   Musculoskeletal: Negative for arthralgias.  Skin: Negative for itching and rash.  Neurological: Negative for extremity weakness.    Hematological: Negative for adenopathy.  Psychiatric/Behavioral: Negative for confusion.    MEDICAL HISTORY:  Past Medical History:  Diagnosis Date   Asthma    Fibrocystic breast    GERD (gastroesophageal reflux disease)    Hyperlipemia    Hypertension    Pneumonia     SURGICAL HISTORY: Past Surgical History:  Procedure Laterality Date   BREAST BIOPSY Right 05/02/2019   right breast stereo bx of calcs, coil clip, path pending   BREAST CYST ASPIRATION     ? side neg   TONSILLECTOMY     TONSILLECTOMY AND ADENOIDECTOMY     TUBAL LIGATION      SOCIAL HISTORY: Social History   Socioeconomic History   Marital status: Married    Spouse name: Not on file   Number of children: Not on file   Years of education: Not on file   Highest education level: Not on file  Occupational History   Not on file  Social Needs   Financial resource strain: Not on file   Food insecurity    Worry: Not on file    Inability: Not on file   Transportation needs    Medical: Not on file    Non-medical: Not on file  Tobacco Use   Smoking status: Former Smoker    Packs/day: 1.00    Years: 3.00    Pack years: 3.00    Types: Cigarettes    Quit date: 43    Years since quitting: 43.5   Smokeless tobacco: Never Used  Substance and Sexual Activity   Alcohol use: Yes    Comment: rare wine   Drug use: No   Sexual activity: Not on file  Lifestyle   Physical activity    Days per week: Not on  file    Minutes per session: Not on file   Stress: Not on file  Relationships   Social connections    Talks on phone: Not on file    Gets together: Not on file    Attends religious service: Not on file    Active member of club or organization: Not on file    Attends meetings of clubs or organizations: Not on file    Relationship status: Not on file   Intimate partner violence    Fear of current or ex partner: Not on file    Emotionally abused: Not on file    Physically  abused: Not on file    Forced sexual activity: Not on file  Other Topics Concern   Not on file  Social History Narrative   Not on file    FAMILY HISTORY: Family History  Problem Relation Age of Onset   Hypertension Mother    Breast cancer Mother 14   Hyperlipidemia Mother    Bladder Cancer Mother    Diabetes Father    Pancreatic cancer Maternal Aunt    Stomach cancer Maternal Uncle    Diabetes Paternal Grandmother    Lung cancer Maternal Uncle     ALLERGIES:  is allergic to hyoscyamine sulfate and latex.  MEDICATIONS:  Current Outpatient Medications  Medication Sig Dispense Refill   albuterol (PROVENTIL HFA;VENTOLIN HFA) 108 (90 Base) MCG/ACT inhaler Inhale 1-2 puffs into the lungs every 6 (six) hours as needed for wheezing or shortness of breath. (Patient not taking: Reported on 05/11/2019) 1 Inhaler 0   Ascorbic Acid (VITAMIN C) 1000 MG tablet Take 1 tablet by mouth daily.     atorvastatin (LIPITOR) 40 MG tablet Take 40 mg by mouth daily.     Biotin 5000 MCG CAPS Take by mouth.     cetirizine (ZYRTEC) 10 MG tablet Take 10 mg by mouth daily.     Cholecalciferol (VITAMIN D3) 25 MCG (1000 UT) CAPS Take by mouth.     co-enzyme Q-10 50 MG capsule Take 100 mg by mouth daily.     ELDERBERRY PO Take by mouth.     famotidine (PEPCID) 20 MG tablet Take by mouth.     fluticasone (FLONASE) 50 MCG/ACT nasal spray Place 2 sprays into both nostrils daily. 16 g 2   lisinopril (ZESTRIL) 10 MG tablet Take 10 mg by mouth daily.     Misc Natural Products (GLUCOSAMINE CHONDROITIN ADV PO) Take by mouth.     No current facility-administered medications for this visit.      PHYSICAL EXAMINATION: ECOG PERFORMANCE STATUS: 0 - Asymptomatic Vitals:   05/10/19 1115  BP: 115/82  Pulse: 73  Resp: 18  Temp: (!) 96.3 F (35.7 C)   Filed Weights   05/10/19 1115  Weight: 223 lb 3.2 oz (101.2 kg)    Physical Exam Constitutional:      General: She is not in acute  distress. HENT:     Head: Normocephalic and atraumatic.  Eyes:     General: No scleral icterus.    Pupils: Pupils are equal, round, and reactive to light.  Neck:     Musculoskeletal: Normal range of motion and neck supple.  Cardiovascular:     Rate and Rhythm: Normal rate and regular rhythm.     Heart sounds: Normal heart sounds.  Pulmonary:     Effort: Pulmonary effort is normal. No respiratory distress.     Breath sounds: No wheezing.  Abdominal:     General:  Bowel sounds are normal. There is no distension.     Palpations: Abdomen is soft. There is no mass.     Tenderness: There is no abdominal tenderness.  Musculoskeletal: Normal range of motion.        General: No deformity.  Skin:    General: Skin is warm and dry.     Findings: No erythema or rash.  Neurological:     Mental Status: She is alert and oriented to person, place, and time.     Cranial Nerves: No cranial nerve deficit.     Coordination: Coordination normal.  Psychiatric:        Behavior: Behavior normal.        Thought Content: Thought content normal.   Breast exam was performed in seated and lying down position. No palpable breast masses bilaterally.  No palpable axillary lymph node bilaterally.    LABORATORY DATA:  I have reviewed the data as listed No results found for: WBC, HGB, HCT, MCV, PLT No results for input(s): NA, K, CL, CO2, GLUCOSE, BUN, CREATININE, CALCIUM, GFRNONAA, GFRAA, PROT, ALBUMIN, AST, ALT, ALKPHOS, BILITOT, BILIDIR, IBILI in the last 8760 hours. Iron/TIBC/Ferritin/ %Sat No results found for: IRON, TIBC, FERRITIN, IRONPCTSAT   Patient had labs done at [facility] clinic on 05/09/2019.  Labs are reviewed.  RADIOGRAPHIC STUDIES: I have personally reviewed the radiological images as listed and agreed with the findings in the report. Mm Diag Breast Tomo Uni Right  Result Date: 04/28/2019 CLINICAL DATA:  Right breast calcifications seen on most recent screening mammography. EXAM: DIGITAL  DIAGNOSTIC UNILATERAL RIGHT MAMMOGRAM WITH CAD AND TOMO COMPARISON:  Previous exam(s). ACR Breast Density Category b: There are scattered areas of fibroglandular density. FINDINGS: Additional mammographic views of the right breast demonstrate a group punctate calcifications in linear distribution in the right breast upper outer quadrant, middle depth. The group measures 2.3 x 0.4 x 0.8 cm. Faint tubular structures are seen associated with the calcifications. No associated mass. Mammographic images were processed with CAD. IMPRESSION: Right breast upper outer quadrant indeterminate calcifications, for which stereotactic core needle biopsy is recommended. RECOMMENDATION: Stereotactic core needle biopsy of the right breast. Initially, either the anterior or posterior portion of the calcifications may be sampled, and if the histology is malignant, then a second stereotactic core needle biopsy may be considered to determine the extent of disease. I have discussed the findings and recommendations with the patient. Results were also provided in writing at the conclusion of the visit. If applicable, a reminder letter will be sent to the patient regarding the next appointment. BI-RADS CATEGORY  4: Suspicious. Electronically Signed   By: Fidela Salisbury M.D.   On: 04/28/2019 16:23   Mm 3d Screen Breast Bilateral  Result Date: 04/20/2019 CLINICAL DATA:  Screening. EXAM: DIGITAL SCREENING BILATERAL MAMMOGRAM WITH TOMO AND CAD COMPARISON:  Previous exam(s). ACR Breast Density Category b: There are scattered areas of fibroglandular density. FINDINGS: In the right breast, calcifications warrant further evaluation with magnified views. In the left breast, no findings suspicious for malignancy. Images were processed with CAD. IMPRESSION: Further evaluation is suggested for calcifications in the right breast. RECOMMENDATION: Diagnostic mammogram of the right breast. (Code:[id number]) The patient will be contacted regarding the  findings, and additional imaging will be scheduled. BI-RADS CATEGORY  0: Incomplete. Need additional imaging evaluation and/or prior mammograms for comparison. Electronically Signed   By: Curlene Dolphin M.D.   On: 04/20/2019 16:10   Mm Clip Placement Right  Result Date: 05/02/2019 CLINICAL DATA:  Status post stereotactic guided core biopsy of calcifications in the UPPER central portion of the RIGHT breast. EXAM: DIAGNOSTIC RIGHT MAMMOGRAM POST STEREOTACTIC BIOPSY COMPARISON:  Previous exam(s). FINDINGS: Mammographic images were obtained following stereotactic guided biopsy of calcifications in the UPPER central portion of the RIGHT breast and placement of a coil shaped clip. Coil shaped clip is identified in the UPPER central portion of the RIGHT breast an adjacent to residual faint calcifications. IMPRESSION: Tissue marker clip in the expected location following biopsy. Final Assessment: Post Procedure Mammograms for Marker Placement Electronically Signed   By: Nolon Nations M.D.   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 CONCORDANT: YES by Dr. Nolon Nations. I telephoned the patient on 05/03/2019 at 5:45 PM and discussed these results and the recommendations stated below. All questions were answered. The patient denies significant pain or bleeding from the biopsy site. Biopsy site care instructions were reviewed and the patient was asked to call [facility] with any questions or issues related to the biopsy. RECOMMENDATION: Surgical referral. Request for surgical referral was relayed to Tanya Nones, RN, breast navigator for [facility] by Jetta Lout, [place] on 05/03/2019.  A surgical referral was arranged with Dr. Peyton Najjar for 05/09/2019 and an oncology referral for 05/10/2019. The patient was notified of the appointment information. Addendum by Jetta Lout, RRA on 05/04/2019. Electronically Signed   By: Nolon Nations M.D.   On: 05/04/2019 17:23   Result Date: 05/08/2019 CLINICAL DATA:  The patient presents for stereotactic guided core biopsy of calcifications in the UPPER central portion of the RIGHT breast. EXAM: RIGHT BREAST STEREOTACTIC CORE NEEDLE BIOPSY COMPARISON:  Previous exams. FINDINGS: The patient and I discussed the procedure of stereotactic-guided biopsy including benefits and alternatives. We discussed the high likelihood of a successful procedure. We discussed the risks of the procedure including infection, bleeding, tissue injury, clip migration, and inadequate sampling. Informed written consent was given. The usual time out protocol was performed immediately prior to the procedure. Using sterile technique and 1% Lidocaine as local anesthetic, under stereotactic guidance, a 9 gauge vacuum assisted device was used to perform core needle biopsy of calcifications in the UPPER central portion of the RIGHT breast using a craniocaudal approach. Specimen radiograph was performed showing calcifications in numerous angle. Specimens with calcifications are identified for pathology. Lesion quadrant: UPPER central RIGHT breast At the conclusion of the procedure, a coil shaped tissue marker clip was deployed into the biopsy cavity. Follow-up 2-view mammogram was performed and dictated separately. IMPRESSION: Stereotactic-guided biopsy of RIGHT breast calcifications. No apparent complications. Electronically Signed: By: Nolon Nations M.D. On: 05/02/2019 08:37    ASSESSMENT & PLAN:  1. Ductal carcinoma in situ (DCIS) of right breast   2. Family history of cancer    Mammogram images were independently reviewed and discussed with patient.  Pathology was reviewed and  discussed.  The DCIS diagnosis and care plan were discussed with patient in detail.  We discussed that DCIS is a non invasive breast cancer,  cancer is only in the duct and has not spread into the tissues around it.  Patient was referred to research department for screening of eligibility for clinical trial.  She does not need eligibility. Management plan  was discussed with patient.  Recommend surgical resection.  Patient has already met with Dr. Peyton Najjar and had a discussion with lumpectomy and was offered sentinel lymph node biopsy due to the possibility of potential invasive component. Patient will follow-up in the clinic 1 week after breast surgery to discuss post surgery treatment plans. If final pathology shows no invasive component, she will need RT followed by AI x 5 years.   #Patient has family history of breast cancer, pancreatic cancer, stomach cancer. Recommend patient to consider about genetic testing.  Patient will consider and update me if she wants to proceed with genetic testing.   All questions were answered. The patient knows to call the clinic with any problems questions or concerns.  Return of visit: 1 week after breast surgery. Thank you for this kind referral and the opportunity to participate in the care of this patient. A copy of today's note is routed to referring provider  Total face to face encounter time for this patient visit was 60 min. >50% of the time was  spent in counseling and coordination of care.    Earlie Server, MD, PhD

## 2019-05-14 MED ORDER — CEFAZOLIN SODIUM-DEXTROSE 2-4 GM/100ML-% IV SOLN
2.0000 g | INTRAVENOUS | Status: AC
  Administered 2019-05-15: 2 g via INTRAVENOUS

## 2019-05-15 ENCOUNTER — Ambulatory Visit: Payer: BC Managed Care – PPO | Admitting: Anesthesiology

## 2019-05-15 ENCOUNTER — Encounter
Admission: RE | Disposition: A | Discharge status: Home / Self Care | Payer: BC Managed Care – PPO | Source: Ambulatory Visit | Attending: General Surgery

## 2019-05-15 ENCOUNTER — Ambulatory Visit
Admission: RE | Admit: 2019-05-15 | Discharge: 2019-05-15 | Disposition: A | Discharge status: Home / Self Care | Payer: BC Managed Care – PPO | Source: Ambulatory Visit | Attending: General Surgery | Admitting: General Surgery

## 2019-05-15 ENCOUNTER — Other Ambulatory Visit: Payer: Self-pay

## 2019-05-15 DIAGNOSIS — E7800 Pure hypercholesterolemia, unspecified: Secondary | ICD-10-CM | POA: Diagnosis not present

## 2019-05-15 DIAGNOSIS — Z79899 Other long term (current) drug therapy: Secondary | ICD-10-CM | POA: Insufficient documentation

## 2019-05-15 DIAGNOSIS — C50411 Malignant neoplasm of upper-outer quadrant of right female breast: Secondary | ICD-10-CM

## 2019-05-15 DIAGNOSIS — Z803 Family history of malignant neoplasm of breast: Secondary | ICD-10-CM | POA: Insufficient documentation

## 2019-05-15 DIAGNOSIS — Z8249 Family history of ischemic heart disease and other diseases of the circulatory system: Secondary | ICD-10-CM | POA: Insufficient documentation

## 2019-05-15 DIAGNOSIS — Z87891 Personal history of nicotine dependence: Secondary | ICD-10-CM | POA: Diagnosis not present

## 2019-05-15 DIAGNOSIS — M1711 Unilateral primary osteoarthritis, right knee: Secondary | ICD-10-CM | POA: Insufficient documentation

## 2019-05-15 DIAGNOSIS — E785 Hyperlipidemia, unspecified: Secondary | ICD-10-CM | POA: Diagnosis not present

## 2019-05-15 DIAGNOSIS — Z170 Estrogen receptor positive status [ER+]: Secondary | ICD-10-CM

## 2019-05-15 DIAGNOSIS — I10 Essential (primary) hypertension: Secondary | ICD-10-CM | POA: Insufficient documentation

## 2019-05-15 DIAGNOSIS — D0511 Intraductal carcinoma in situ of right breast: Secondary | ICD-10-CM | POA: Diagnosis not present

## 2019-05-15 HISTORY — PX: PARTIAL MASTECTOMY WITH NEEDLE LOCALIZATION: SHX6008

## 2019-05-15 SURGERY — PARTIAL MASTECTOMY WITH NEEDLE LOCALIZATION
Anesthesia: General | Laterality: Right

## 2019-05-15 MED ORDER — FENTANYL CITRATE (PF) 100 MCG/2ML IJ SOLN
INTRAMUSCULAR | Status: AC
  Filled 2019-05-15: qty 2

## 2019-05-15 MED ORDER — ONDANSETRON HCL 4 MG/2ML IJ SOLN
INTRAMUSCULAR | Status: DC | PRN
  Administered 2019-05-15: 4 mg via INTRAVENOUS

## 2019-05-15 MED ORDER — PROPOFOL 10 MG/ML IV BOLUS
INTRAVENOUS | Status: AC
  Filled 2019-05-15: qty 20

## 2019-05-15 MED ORDER — LIDOCAINE HCL (PF) 2 % IJ SOLN
INTRAMUSCULAR | Status: AC
  Filled 2019-05-15: qty 10

## 2019-05-15 MED ORDER — LIDOCAINE HCL (CARDIAC) PF 100 MG/5ML IV SOSY
PREFILLED_SYRINGE | INTRAVENOUS | Status: DC | PRN
  Administered 2019-05-15: 100 mg via INTRAVENOUS

## 2019-05-15 MED ORDER — HYDROCODONE-ACETAMINOPHEN 5-325 MG PO TABS: 1.0000 | ORAL_TABLET | ORAL | 0 refills | Status: AC | PRN

## 2019-05-15 MED ORDER — DEXAMETHASONE SODIUM PHOSPHATE 10 MG/ML IJ SOLN
INTRAMUSCULAR | Status: DC | PRN
  Administered 2019-05-15: 5 mg via INTRAVENOUS

## 2019-05-15 MED ORDER — PROPOFOL 10 MG/ML IV BOLUS
INTRAVENOUS | Status: DC | PRN
  Administered 2019-05-15: 20 mg via INTRAVENOUS
  Administered 2019-05-15: 200 mg via INTRAVENOUS
  Administered 2019-05-15: 50 mg via INTRAVENOUS

## 2019-05-15 MED ORDER — METHYLENE BLUE 0.5 % INJ SOLN
INTRAVENOUS | Status: AC
  Filled 2019-05-15: qty 10

## 2019-05-15 MED ORDER — BUPIVACAINE-EPINEPHRINE 0.5% -1:200000 IJ SOLN
INTRAMUSCULAR | Status: DC | PRN
  Administered 2019-05-15: 24 mL

## 2019-05-15 MED ORDER — PROMETHAZINE HCL 25 MG/ML IJ SOLN: 6.2500 mg | INTRAMUSCULAR | Status: DC | PRN

## 2019-05-15 MED ORDER — SEVOFLURANE IN SOLN
RESPIRATORY_TRACT | Status: AC
  Filled 2019-05-15: qty 250

## 2019-05-15 MED ORDER — HYDROCODONE-ACETAMINOPHEN 5-325 MG PO TABS
1.0000 | ORAL_TABLET | Freq: Once | ORAL | Status: AC
  Administered 2019-05-15: 1 via ORAL

## 2019-05-15 MED ORDER — MIDAZOLAM HCL 2 MG/2ML IJ SOLN
INTRAMUSCULAR | Status: AC
  Filled 2019-05-15: qty 2

## 2019-05-15 MED ORDER — MIDAZOLAM HCL 2 MG/2ML IJ SOLN
INTRAMUSCULAR | Status: DC | PRN
  Administered 2019-05-15: 2 mg via INTRAVENOUS

## 2019-05-15 MED ORDER — TECHNETIUM TC 99M SULFUR COLLOID FILTERED
1.0000 | Freq: Once | INTRAVENOUS | Status: AC | PRN
  Administered 2019-05-15: 0.659 via INTRADERMAL

## 2019-05-15 MED ORDER — CEFAZOLIN SODIUM-DEXTROSE 2-4 GM/100ML-% IV SOLN
INTRAVENOUS | Status: AC
  Filled 2019-05-15: qty 100

## 2019-05-15 MED ORDER — FENTANYL CITRATE (PF) 100 MCG/2ML IJ SOLN
INTRAMUSCULAR | Status: DC | PRN
  Administered 2019-05-15 (×2): 25 ug via INTRAVENOUS
  Administered 2019-05-15: 50 ug via INTRAVENOUS

## 2019-05-15 MED ORDER — HYDROCODONE-ACETAMINOPHEN 5-325 MG PO TABS
ORAL_TABLET | ORAL | Status: AC
  Filled 2019-05-15: qty 1

## 2019-05-15 MED ORDER — FENTANYL CITRATE (PF) 100 MCG/2ML IJ SOLN
25.0000 ug | INTRAMUSCULAR | Status: DC | PRN
  Administered 2019-05-15: 50 ug via INTRAVENOUS

## 2019-05-15 MED ORDER — LACTATED RINGERS IV SOLN
INTRAVENOUS | Status: DC
  Administered 2019-05-15: 12:00:00 via INTRAVENOUS

## 2019-05-15 SURGICAL SUPPLY — 36 items
BLADE SURG 15 STRL LF DISP TIS (BLADE) ×1 IMPLANT
BLADE SURG 15 STRL SS (BLADE) ×2
CANISTER SUCT 1200ML W/VALVE (MISCELLANEOUS) ×3 IMPLANT
CHLORAPREP W/TINT 26 (MISCELLANEOUS) ×3 IMPLANT
COVER WAND RF STERILE (DRAPES) ×3 IMPLANT
DERMABOND ADVANCED (GAUZE/BANDAGES/DRESSINGS) ×4
DERMABOND ADVANCED .7 DNX12 (GAUZE/BANDAGES/DRESSINGS) ×2 IMPLANT
DEVICE DUBIN SPECIMEN MAMMOGRA (MISCELLANEOUS) ×3 IMPLANT
DRAPE LAPAROTOMY 77X122 PED (DRAPES) ×3 IMPLANT
ELECT REM PT RETURN 9FT ADLT (ELECTROSURGICAL) ×3
ELECTRODE REM PT RTRN 9FT ADLT (ELECTROSURGICAL) ×1 IMPLANT
GAUZE 4X4 16PLY RFD (DISPOSABLE) ×3 IMPLANT
GLOVE BIO SURGEON STRL SZ 6.5 (GLOVE) ×2 IMPLANT
GLOVE BIO SURGEONS STRL SZ 6.5 (GLOVE) ×1
GLOVE INDICATOR 6.5 STRL GRN (GLOVE) ×3 IMPLANT
GOWN STRL REUS W/ TWL LRG LVL3 (GOWN DISPOSABLE) ×2 IMPLANT
GOWN STRL REUS W/TWL LRG LVL3 (GOWN DISPOSABLE) ×4
KIT TURNOVER KIT A (KITS) ×3 IMPLANT
LABEL OR SOLS (LABEL) ×3 IMPLANT
MARGIN MAP 10MM (MISCELLANEOUS) ×3 IMPLANT
NEEDLE HYPO 25X1 1.5 SAFETY (NEEDLE) ×3 IMPLANT
PACK BASIN MINOR ARMC (MISCELLANEOUS) ×3 IMPLANT
RETRACTOR WOUND ALXS 18CM SML (MISCELLANEOUS) ×1 IMPLANT
RTRCTR WOUND ALEXIS O 18CM SML (MISCELLANEOUS) ×3
SLEVE PROBE SENORX GAMMA FIND (MISCELLANEOUS) ×3 IMPLANT
SUT ETHILON 3-0 FS-10 30 BLK (SUTURE) ×6
SUT MNCRL 4-0 (SUTURE) ×2
SUT MNCRL 4-0 27XMFL (SUTURE) ×1
SUT SILK 2 0 SH (SUTURE) IMPLANT
SUT VIC AB 3-0 SH 27 (SUTURE) ×4
SUT VIC AB 3-0 SH 27X BRD (SUTURE) ×2 IMPLANT
SUT VICRYL 0 AB UR-6 (SUTURE) ×3 IMPLANT
SUTURE EHLN 3-0 FS-10 30 BLK (SUTURE) ×2 IMPLANT
SUTURE MNCRL 4-0 27XMF (SUTURE) ×1 IMPLANT
SYR 10ML LL (SYRINGE) ×3 IMPLANT
WATER STERILE IRR 1000ML POUR (IV SOLUTION) ×3 IMPLANT

## 2019-05-15 NOTE — OR Nursing (Signed)
Husband called with times and plan for today.

## 2019-05-15 NOTE — Anesthesia Procedure Notes (Signed)
Procedure Name: LMA Insertion Date/Time: 05/15/2019 12:58 PM Performed by: Johnna Acosta, CRNA Pre-anesthesia Checklist: Patient identified, Emergency Drugs available, Suction available, Patient being monitored and Timeout performed Patient Re-evaluated:Patient Re-evaluated prior to induction Oxygen Delivery Method: Circle system utilized Preoxygenation: Pre-oxygenation with 100% oxygen Induction Type: IV induction LMA: LMA inserted LMA Size: 4.0 Tube type: Oral Number of attempts: 1 Placement Confirmation: positive ETCO2 and breath sounds checked- equal and bilateral Tube secured with: Tape Dental Injury: Teeth and Oropharynx as per pre-operative assessment

## 2019-05-15 NOTE — Interval H&P Note (Signed)
History and Physical Interval Note:  05/15/2019 12:17 PM  Jane Gardner  has presented today for surgery, with the diagnosis of C50.411, Z17.0 MALIGNANT NEOPLASM OF UPPER-OUTER QUADRANT OF RIGHT BREAST IN FEMALE, ESTROGEN RECEPTOR POSITIVE.  The various methods of treatment have been discussed with the patient and family. After consideration of risks, benefits and other options for treatment, the patient has consented to  Procedure(s): PARTIAL MASTECTOMY WITH NEEDLE LOCALIZATION AND SENTINEL NODE, RIGHT (Right) as a surgical intervention.  The patient's history has been reviewed, patient examined, no change in status, stable for surgery.  I have reviewed the patient's chart and labs. Right sided marked in the pre procedure area.   Questions were answered to the patient's satisfaction.     Herbert Pun

## 2019-05-15 NOTE — Anesthesia Preprocedure Evaluation (Signed)
Anesthesia Evaluation  Patient identified by MRN, date of birth, ID band Patient awake    Reviewed: Allergy & Precautions, H&P , NPO status , Patient's Chart, lab work & pertinent test results, reviewed documented beta blocker date and time   History of Anesthesia Complications Negative for: history of anesthetic complications  Airway Mallampati: I  TM Distance: >3 FB Neck ROM: full    Dental  (+) Dental Advidsory Given, Caps, Implants, Teeth Intact   Pulmonary neg shortness of breath, asthma , neg COPD, neg recent URI, former smoker,           Cardiovascular Exercise Tolerance: Good hypertension, (-) angina(-) Past MI (-) dysrhythmias (-) Valvular Problems/Murmurs     Neuro/Psych  Headaches, neg Seizures negative psych ROS   GI/Hepatic Neg liver ROS, GERD  ,NAFLD   Endo/Other  negative endocrine ROS  Renal/GU negative Renal ROS  negative genitourinary   Musculoskeletal   Abdominal   Peds  Hematology negative hematology ROS (+)   Anesthesia Other Findings Past Medical History: No date: Asthma No date: Fibrocystic breast No date: GERD (gastroesophageal reflux disease) No date: Hyperlipemia No date: Hypertension No date: Pneumonia   Reproductive/Obstetrics negative OB ROS                             Anesthesia Physical Anesthesia Plan  ASA: II  Anesthesia Plan: General   Post-op Pain Management:    Induction: Intravenous  PONV Risk Score and Plan: 3 and Ondansetron, Dexamethasone, Midazolam, Promethazine and Treatment may vary due to age or medical condition  Airway Management Planned: Oral ETT and LMA  Additional Equipment:   Intra-op Plan:   Post-operative Plan: Extubation in OR  Informed Consent: I have reviewed the patients History and Physical, chart, labs and discussed the procedure including the risks, benefits and alternatives for the proposed anesthesia with the  patient or authorized representative who has indicated his/her understanding and acceptance.     Dental Advisory Given  Plan Discussed with: Anesthesiologist, CRNA and Surgeon  Anesthesia Plan Comments:         Anesthesia Quick Evaluation

## 2019-05-15 NOTE — Anesthesia Post-op Follow-up Note (Signed)
Anesthesia QCDR form completed.        

## 2019-05-15 NOTE — Transfer of Care (Signed)
Immediate Anesthesia Transfer of Care Note  Patient: Jere Vanburen  Procedure(s) Performed: PARTIAL MASTECTOMY WITH NEEDLE LOCALIZATION AND SENTINEL NODE, RIGHT (Right )  Patient Location: PACU  Anesthesia Type:General  Level of Consciousness: sedated  Airway & Oxygen Therapy: Patient Spontanous Breathing and Patient connected to face mask oxygen  Post-op Assessment: Report given to RN and Post -op Vital signs reviewed and stable  Post vital signs: Reviewed and stable  Last Vitals:  Vitals Value Taken Time  BP 125/79 05/15/19 1516  Temp    Pulse 64 05/15/19 1516  Resp 14 05/15/19 1516  SpO2 100 % 05/15/19 1516  Vitals shown include unvalidated device data.  Last Pain:  Vitals:   05/15/19 1516  TempSrc:   PainSc: 0-No pain         Complications: No apparent anesthesia complications

## 2019-05-15 NOTE — Transfer of Care (Deleted)
Immediate Anesthesia Transfer of Care Note  Patient: Jane Gardner  Procedure(s) Performed: PARTIAL MASTECTOMY WITH NEEDLE LOCALIZATION AND SENTINEL NODE, RIGHT (Right )  Patient Location: PACU  Anesthesia Type:General  Level of Consciousness: awake  Airway & Oxygen Therapy: Patient Spontanous Breathing  Post-op Assessment: Report given to RN  Post vital signs: stable  Last Vitals:  Vitals Value Taken Time  BP    Temp    Pulse    Resp    SpO2      Last Pain:  Vitals:   05/15/19 1123  TempSrc: Temporal  PainSc: 0-No pain         Complications: No apparent anesthesia complications

## 2019-05-15 NOTE — Discharge Instructions (Signed)
°  Diet: Resume home heart healthy regular diet.   Activity: Increase activity as tolerated, light activity and walking are encouraged. Do not drive or drink alcohol if taking narcotic pain medications.  Wound care: May shower with soapy water and pat dry (do not rub incisions), but no baths or submerging incision underwater until follow-up. (no swimming)   Medications: Resume all home medications. For mild to moderate pain: acetaminophen (Tylenol) or ibuprofen (if no kidney disease). Combining Tylenol with alcohol can substantially increase your risk of causing liver disease. Narcotic pain medications, if prescribed, can be used for severe pain, though may cause nausea, constipation, and drowsiness. Do not combine Tylenol and Norco within a 6 hour period as Norco contains Tylenol. If you do not need the narcotic pain medication, you do not need to fill the prescription.  Call office [phone]) at any time if any questions, worsening pain, fevers/chills, bleeding, drainage from incision site, or other concerns.  AMBULATORY SURGERY  DISCHARGE INSTRUCTIONS   1) The drugs that you were given will stay in your system until tomorrow so for the next 24 hours you should not:  A) Drive an automobile B) Make any legal decisions C) Drink any alcoholic beverage   2) You may resume regular meals tomorrow.  Today it is better to start with liquids and gradually work up to solid foods.  You may eat anything you prefer, but it is better to start with liquids, then soup and crackers, and gradually work up to solid foods.   3) Please notify your doctor immediately if you have any unusual bleeding, trouble breathing, redness and pain at the surgery site, drainage, fever, or pain not relieved by medication.    4) Additional Instructions:        Please contact your physician with any problems or Same Day Surgery at [phone], Monday through Friday 6 am to 4 pm, or [place] [facility]  number at [phone].

## 2019-05-15 NOTE — Op Note (Signed)
Preoperative diagnosis: Right breast carcinoma.  Postoperative diagnosis: Right breast carcinoma.   Procedure: Right needle-localized partial mastectomy.                       Right Axillary Sentinel Lymph node biopsy  Anesthesia: GETA  Surgeon: Dr. Windell Moment  Wound Classification: Clean  Indications: Patient is a 63 y.o. female with a nonpalpable right breast mass noted on mammography with core biopsy demonstrating ductal carcinoma in situ with comedonecrosis requires needle-localized partial mastectomy for treatment with sentinel lymph node biopsy.   Findings: 1. Specimen mammography shows marker and wire on specimen 2. Pathology call refers gross examination of margins was close to the anterior margin. 3. No other palpable mass or lymph node identified.   Description of procedure: Preoperative needle localization was performed by radiology. In the nuclear medicine suite, the subareolar region was injected with Tc-99 sulfur colloid. Localization studies were reviewed. The patient was taken to the operating room and placed supine on the operating table, and after general anesthesia the right chest and axilla were prepped and draped in the usual sterile fashion. A time-out was completed verifying correct patient, procedure, site, positioning, and implant(s) and/or special equipment prior to beginning this procedure.  By comparing the localization studies with the direction and skin entry site of the needle, the probable trajectory and location of the mass was visualized. A circumareolar skin incision was planned in such a way as to minimize the amount of dissection to reach the mass.  The skin incision was made. Flaps were raised and the location of the wire confirmed. The wire was delivered into the wound. A 2-0 silk figure-of-eight stay suture was placed around the wire and used for retraction. Dissection was then taken down circumferentially, taking care to include the entire localizing  needle and a wide margin of grossly normal tissue. The specimen and entire localizing wire were removed. The specimen was oriented and sent to radiology with the localization studies.  Pathology call reporting that the anterior margin was abusing the margin.  Reexcision of the anterior margin which is a skin was done.  The wound was irrigated. Hemostasis was checked. The wound was closed with interrupted sutures of 3-0 Vicryl and a subcuticular suture of Monocryl 3-0. No attempt was made to close the dead space. A dressing was applied.   A hand-held gamma probe was used to identify the location of the hottest spot in the axilla. An incision was made around the caudal axillary hairline. Dissection was carried down until subdermal facias was advanced. The probe was placed and again, the point of maximal count was found. Dissection continue until nodule was identified. The node was excised in its entirety. Ex vivo, the node measured 488 counts when placed on the probe. The bed of the node measured 21 counts.  No additional hot spots were identified. No clinically abnormal nodes were palpated. The procedure was terminated. Hemostasis was achieved and the wound closed in layers with deep interrupted 3-0 Vicryl and skin was closed with subcuticular suture of Monocryl 3-0.  The patient tolerated the procedure well and was taken to the postanesthesia care unit in stable condition.   Specimen: Right Breast mass (Orientation markers used: Caudal, Medial, Skin)                    Sentinel Lymph nodes  Complications: None  Estimated Blood Loss: 10 mL

## 2019-05-16 ENCOUNTER — Encounter: Payer: Self-pay | Admitting: General Surgery

## 2019-05-17 LAB — SURGICAL PATHOLOGY

## 2019-05-18 ENCOUNTER — Encounter: Payer: Self-pay | Admitting: *Deleted

## 2019-05-18 NOTE — Progress Notes (Signed)
Called patient to follow up post surgery on Monday.  States she is doing well.  No complaints.  She has an appointment to follow up with Dr. Tasia Catchings on Monday.  Patient is to call with any questions or needs.

## 2019-05-19 ENCOUNTER — Other Ambulatory Visit
Admission: RE | Admit: 2019-05-19 | Discharge: 2019-05-19 | Disposition: A | Discharge status: Home / Self Care | Payer: BC Managed Care – PPO | Source: Ambulatory Visit | Attending: General Surgery | Admitting: General Surgery

## 2019-05-19 ENCOUNTER — Ambulatory Visit: Payer: Self-pay | Admitting: General Surgery

## 2019-05-19 ENCOUNTER — Other Ambulatory Visit: Payer: Self-pay

## 2019-05-19 DIAGNOSIS — Z1159 Encounter for screening for other viral diseases: Secondary | ICD-10-CM | POA: Insufficient documentation

## 2019-05-19 LAB — SARS CORONAVIRUS 2 (TAT 6-24 HRS): SARS Coronavirus 2: NEGATIVE

## 2019-05-22 ENCOUNTER — Inpatient Hospital Stay: Payer: BC Managed Care – PPO | Admitting: Oncology

## 2019-05-23 MED ORDER — CEFAZOLIN SODIUM-DEXTROSE 2-4 GM/100ML-% IV SOLN
2.0000 g | INTRAVENOUS | Status: AC
  Administered 2019-05-24: 2 g via INTRAVENOUS

## 2019-05-23 NOTE — Anesthesia Postprocedure Evaluation (Signed)
Anesthesia Post Note  Patient: Jane Gardner  Procedure(s) Performed: PARTIAL MASTECTOMY WITH NEEDLE LOCALIZATION AND SENTINEL NODE, RIGHT (Right )  Patient location during evaluation: PACU Anesthesia Type: General Level of consciousness: awake and alert Pain management: pain level controlled Vital Signs Assessment: post-procedure vital signs reviewed and stable Respiratory status: spontaneous breathing, nonlabored ventilation, respiratory function stable and patient connected to nasal cannula oxygen Cardiovascular status: blood pressure returned to baseline and stable Postop Assessment: no apparent nausea or vomiting Anesthetic complications: no     Last Vitals:  Vitals:   05/15/19 1628 05/15/19 1720  BP: (!) 157/78 (!) 161/87  Pulse: 61 (!) 57  Resp: 16 18  Temp: 36.5 C   SpO2: 100% 99%    Last Pain:  Vitals:   05/15/19 1628  TempSrc: Temporal                 Molli Barrows

## 2019-05-24 ENCOUNTER — Encounter: Payer: Self-pay | Admitting: *Deleted

## 2019-05-24 ENCOUNTER — Ambulatory Visit: Payer: BC Managed Care – PPO | Admitting: Anesthesiology

## 2019-05-24 ENCOUNTER — Ambulatory Visit
Admission: RE | Admit: 2019-05-24 | Discharge: 2019-05-24 | Disposition: A | Discharge status: Home / Self Care | Payer: BC Managed Care – PPO | Attending: General Surgery | Admitting: General Surgery

## 2019-05-24 ENCOUNTER — Encounter
Admission: RE | Disposition: A | Discharge status: Home / Self Care | Payer: Self-pay | Source: Home / Self Care | Attending: General Surgery

## 2019-05-24 DIAGNOSIS — I10 Essential (primary) hypertension: Secondary | ICD-10-CM | POA: Diagnosis not present

## 2019-05-24 DIAGNOSIS — J309 Allergic rhinitis, unspecified: Secondary | ICD-10-CM | POA: Diagnosis not present

## 2019-05-24 DIAGNOSIS — J302 Other seasonal allergic rhinitis: Secondary | ICD-10-CM | POA: Diagnosis not present

## 2019-05-24 DIAGNOSIS — Z87891 Personal history of nicotine dependence: Secondary | ICD-10-CM | POA: Insufficient documentation

## 2019-05-24 DIAGNOSIS — K219 Gastro-esophageal reflux disease without esophagitis: Secondary | ICD-10-CM | POA: Diagnosis not present

## 2019-05-24 DIAGNOSIS — Z803 Family history of malignant neoplasm of breast: Secondary | ICD-10-CM | POA: Diagnosis not present

## 2019-05-24 DIAGNOSIS — E7800 Pure hypercholesterolemia, unspecified: Secondary | ICD-10-CM | POA: Diagnosis not present

## 2019-05-24 DIAGNOSIS — Z79899 Other long term (current) drug therapy: Secondary | ICD-10-CM | POA: Diagnosis not present

## 2019-05-24 DIAGNOSIS — Z6838 Body mass index (BMI) 38.0-38.9, adult: Secondary | ICD-10-CM | POA: Diagnosis not present

## 2019-05-24 DIAGNOSIS — D0591 Unspecified type of carcinoma in situ of right breast: Secondary | ICD-10-CM | POA: Insufficient documentation

## 2019-05-24 HISTORY — PX: MASTECTOMY, PARTIAL: SHX709

## 2019-05-24 SURGERY — MASTECTOMY PARTIAL
Anesthesia: General | Laterality: Right

## 2019-05-24 MED ORDER — PROPOFOL 10 MG/ML IV BOLUS
INTRAVENOUS | Status: DC | PRN
  Administered 2019-05-24: 200 mg via INTRAVENOUS

## 2019-05-24 MED ORDER — MIDAZOLAM HCL 2 MG/2ML IJ SOLN
INTRAMUSCULAR | Status: AC
  Filled 2019-05-24: qty 2

## 2019-05-24 MED ORDER — BUPIVACAINE-EPINEPHRINE (PF) 0.5% -1:200000 IJ SOLN
INTRAMUSCULAR | Status: DC | PRN
  Administered 2019-05-24: 30 mL

## 2019-05-24 MED ORDER — PROPOFOL 10 MG/ML IV BOLUS
INTRAVENOUS | Status: AC
  Filled 2019-05-24: qty 20

## 2019-05-24 MED ORDER — CEFAZOLIN SODIUM-DEXTROSE 2-4 GM/100ML-% IV SOLN
INTRAVENOUS | Status: AC
  Filled 2019-05-24: qty 100

## 2019-05-24 MED ORDER — LIDOCAINE HCL (CARDIAC) PF 100 MG/5ML IV SOSY
PREFILLED_SYRINGE | INTRAVENOUS | Status: DC | PRN
  Administered 2019-05-24: 100 mg via INTRAVENOUS

## 2019-05-24 MED ORDER — FAMOTIDINE 20 MG PO TABS
20.0000 mg | ORAL_TABLET | Freq: Once | ORAL | Status: AC
  Administered 2019-05-24: 20 mg via ORAL

## 2019-05-24 MED ORDER — BUPIVACAINE-EPINEPHRINE (PF) 0.5% -1:200000 IJ SOLN
INTRAMUSCULAR | Status: AC
  Filled 2019-05-24: qty 30

## 2019-05-24 MED ORDER — ONDANSETRON HCL 4 MG/2ML IJ SOLN: 4.0000 mg | Freq: Once | INTRAMUSCULAR | Status: DC | PRN

## 2019-05-24 MED ORDER — LACTATED RINGERS IV SOLN
INTRAVENOUS | Status: DC
  Administered 2019-05-24: 09:00:00 via INTRAVENOUS

## 2019-05-24 MED ORDER — ONDANSETRON HCL 4 MG/2ML IJ SOLN
INTRAMUSCULAR | Status: DC | PRN
  Administered 2019-05-24: 4 mg via INTRAVENOUS

## 2019-05-24 MED ORDER — MIDAZOLAM HCL 2 MG/2ML IJ SOLN
INTRAMUSCULAR | Status: DC | PRN
  Administered 2019-05-24: 2 mg via INTRAVENOUS

## 2019-05-24 MED ORDER — FENTANYL CITRATE (PF) 100 MCG/2ML IJ SOLN
INTRAMUSCULAR | Status: AC
  Filled 2019-05-24: qty 2

## 2019-05-24 MED ORDER — FENTANYL CITRATE (PF) 250 MCG/5ML IJ SOLN
INTRAMUSCULAR | Status: AC
  Filled 2019-05-24: qty 5

## 2019-05-24 MED ORDER — DEXAMETHASONE SODIUM PHOSPHATE 10 MG/ML IJ SOLN
INTRAMUSCULAR | Status: DC | PRN
  Administered 2019-05-24: 10 mg via INTRAVENOUS

## 2019-05-24 MED ORDER — FENTANYL CITRATE (PF) 100 MCG/2ML IJ SOLN: 25.0000 ug | INTRAMUSCULAR | Status: DC | PRN

## 2019-05-24 MED ORDER — FENTANYL CITRATE (PF) 100 MCG/2ML IJ SOLN
INTRAMUSCULAR | Status: DC | PRN
  Administered 2019-05-24: 50 ug via INTRAVENOUS
  Administered 2019-05-24: 25 ug via INTRAVENOUS
  Administered 2019-05-24 (×2): 50 ug via INTRAVENOUS

## 2019-05-24 SURGICAL SUPPLY — 39 items
CANISTER SUCT 1200ML W/VALVE (MISCELLANEOUS) ×3 IMPLANT
CHLORAPREP W/TINT 26 (MISCELLANEOUS) ×3 IMPLANT
CNTNR SPEC 2.5X3XGRAD LEK (MISCELLANEOUS) ×1
CONT SPEC 4OZ STER OR WHT (MISCELLANEOUS) ×2
CONTAINER SPEC 2.5X3XGRAD LEK (MISCELLANEOUS) ×1 IMPLANT
COVER WAND RF STERILE (DRAPES) ×3 IMPLANT
DERMABOND ADVANCED (GAUZE/BANDAGES/DRESSINGS) ×2
DERMABOND ADVANCED .7 DNX12 (GAUZE/BANDAGES/DRESSINGS) ×1 IMPLANT
DEVICE DUBIN SPECIMEN MAMMOGRA (MISCELLANEOUS) ×3 IMPLANT
DRAPE LAPAROTOMY 77X122 PED (DRAPES) ×3 IMPLANT
ELECT CAUTERY BLADE TIP 2.5 (TIP) ×3
ELECT REM PT RETURN 9FT ADLT (ELECTROSURGICAL) ×3
ELECTRODE CAUTERY BLDE TIP 2.5 (TIP) ×1 IMPLANT
ELECTRODE REM PT RTRN 9FT ADLT (ELECTROSURGICAL) ×1 IMPLANT
GLOVE BIO SURGEON STRL SZ 6.5 (GLOVE) ×2 IMPLANT
GLOVE BIO SURGEONS STRL SZ 6.5 (GLOVE) ×1
GLOVE INDICATOR 6.5 STRL GRN (GLOVE) ×3 IMPLANT
GOWN STRL REUS W/ TWL LRG LVL3 (GOWN DISPOSABLE) ×3 IMPLANT
GOWN STRL REUS W/TWL LRG LVL3 (GOWN DISPOSABLE) ×6
KIT TURNOVER KIT A (KITS) ×3 IMPLANT
LABEL OR SOLS (LABEL) ×1 IMPLANT
MARGIN MAP 10MM (MISCELLANEOUS) ×3 IMPLANT
NDL HYPO 25X1 1.5 SAFETY (NEEDLE) ×2 IMPLANT
NEEDLE HYPO 25X1 1.5 SAFETY (NEEDLE) ×6 IMPLANT
PACK BASIN MINOR ARMC (MISCELLANEOUS) ×3 IMPLANT
SPONGE LAP 18X18 RF (DISPOSABLE) ×2 IMPLANT
SUT ETHILON 3-0 FS-10 30 BLK (SUTURE) ×3
SUT MNCRL 4-0 (SUTURE) ×2
SUT MNCRL 4-0 27XMFL (SUTURE) ×1
SUT PROLENE 4 0 RB 1 (SUTURE) ×2
SUT PROLENE 4-0 RB1 .5 CRCL 36 (SUTURE) IMPLANT
SUT SILK 2 0 SH (SUTURE) ×3 IMPLANT
SUT VIC AB 3-0 SH 27 (SUTURE) ×2
SUT VIC AB 3-0 SH 27X BRD (SUTURE) ×1 IMPLANT
SUTURE EHLN 3-0 FS-10 30 BLK (SUTURE) ×1 IMPLANT
SUTURE MNCRL 4-0 27XMF (SUTURE) ×1 IMPLANT
SYR 10ML LL (SYRINGE) ×3 IMPLANT
SYR BULB IRRIG 60ML STRL (SYRINGE) ×2 IMPLANT
WATER STERILE IRR 1000ML POUR (IV SOLUTION) ×3 IMPLANT

## 2019-05-24 NOTE — H&P (Signed)
PATIENT PROFILE: Jane Gardner a 63 y.o.femalewho presents to the OR for re excision of DCIS with positive margins on the right breast.   OPF:[id number], Marene Lenz, MD  HISTORY OF PRESENT ILLNESS: Ms.Bowersreports he had her screening mammogram and she was found with a suspicious lesion. She underwent diagnostic mammogram and ultrasound which shows a linear calcifications. 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 She had partial mastectomy with SLNBx. On gross evaluation of margins the closer margin was the superior margin and it was re excised in the same surgery. On final pathology, the lateral and posterior margin were positive. Patient was notified about results and recommendations of re excision of positive margin. Patient denied any breast mass, any skin changes or any nipple retraction or discharge.  Family history of breast cancer:Mother with breast cancer Family history of other cancers:Aunt with pancreatic cancer, uncle with lung cancer. Menarche:64 years old Menopause:In her 58s Used OCP:14 years Used estrogen and progesterone therapy:Premarin History of Radiation to the chest:None No previous breast biopsy.  PROBLEM LIST:                   Problem List Date Reviewed:12/09/2017      Noted   Hypercholesterolemia 03/15/2017   Seasonal allergic rhinitis 01/13/2016   Allergic conjunctivitis of both eyes 01/13/2016   Chronic cough 01/13/2016   Fatty liver: has completed Hep A and B series 04/05/2015   Benign essential hypertension 03/05/2015   Migraine without aura and without status migrainosus, not intractable 06/01/2011   GERD (gastroesophageal reflux disease) 06/01/2011   Hyperglycemia 06/01/2011      GENERAL REVIEW OF SYSTEMS:  General ROS: negative for - chills, fatigue, fever, weight gain or weight loss Allergy and Immunology ROS: negative for - hives  Hematological  and Lymphatic ROS: negative for - bleeding problems or bruising, negative for palpable nodes Endocrine ROS: negative for - heat or cold intolerance, hair changes Respiratory ROS: negative for - cough, shortness of breath or wheezing Cardiovascular ROS: no chest pain or palpitations GI ROS: negative for nausea, vomiting, abdominal pain, diarrhea, constipation Musculoskeletal ROS: negative for - joint swelling or muscle pain Neurological ROS: negative for - confusion, syncope Dermatological ROS: negative for pruritus and rash Psychiatric: negative for anxiety, depression, difficulty sleeping and memory loss  MEDICATIONS: CurrentMedications        Current Outpatient Medications  Medication Sig Dispense Refill  . ascorbic acid, vitamin C, (VITAMIN C) 1000 MG tablet Take 100 mg by mouth once daily     . ascorbic acid/collagen hydr (COLLAGEN PLUS VITAMIN C ORAL) Take 2,500 mg by mouth 3 (three) times daily    . atorvastatin (LIPITOR) 40 MG tablet Take 1 tablet (40 mg total) by mouth once daily 90 tablet 3  . BIOTIN ORAL Take by mouth.    . cetirizine (ZYRTEC) 10 mg capsule 1 tab by mouth daily    . cholecalciferol (VITAMIN D3) 1,000 unit capsule Take 1,000 Units by mouth every other day     . coenzyme Q10 10 mg capsule Take 100 mg by mouth once daily.      Marland Kitchen ELDERBERRY FRUIT ORAL Take by mouth w/ zinc & vitamins - 2 daily    . famotidine (PEPCID) 20 MG tablet Take 20 mg by mouth 2 (two) times daily    . fluticasone (FLONASE) 50 mcg/actuation nasal spray 2 sprays by Nasal route once daily     . glucosamine/msm/csa/cal/hrb115 (GLUCOSAMIN-CHOND-MSM-CAL-115HC ORAL) Take  by mouth    . lisinopriL (ZESTRIL) 10 MG tablet Take 1 tablet (10 mg total) by mouth once daily 90 tablet 4  . potassium chloride (KLOR-CON) 10 MEQ ER tablet Take 2 tablets (20 mEq total) by mouth once daily 180 tablet 3   No current facility-administered medications for this visit.       ALLERGIES: Latex and Levsin [hyoscyamine sulfate]  PAST MEDICAL HISTORY:     Past Medical History:  Diagnosis Date  . Allergic conjunctivitis of both eyes 01/13/2016  . Arthritis 2018   right knee  . Chronic urticaria    secondary to latex/elastic exposure  . Depression 1987  . Dyspepsia   . Fatty liver - Hep B completed. 1st Hep A 04/05/15 04/05/2015  . GERD (gastroesophageal reflux disease)   . Hypercholesterolemia 03/15/2017  . Hyperglycemia   . Hyperlipidemia   . Hypertension 10.20.09  . Migraines    two per month  . Morbid obesity (CMS-HCC)   . Plantar fasciitis   . Seasonal allergic rhinitis 01/13/2016  . Uterine fibroid july 2000   vs adenomyosis on ultrasound,endometrial biopsy negative    PAST SURGICAL HISTORY:      Past Surgical History:  Procedure Laterality Date  . TONSILLECTOMY    . TUBAL LIGATION  1984    FAMILY HISTORY:      Family History  Problem Relation Age of Onset  . High blood pressure (Hypertension) Mother   . Breast cancer Mother   . Hyperlipidemia (Elevated cholesterol) Mother   . Diabetes type II Father   . Deep vein thrombosis (DVT or abnormal blood clot formation) Father   . Diabetes Father   . Hyperlipidemia (Elevated cholesterol) Sister   . Stroke Maternal Grandmother   . Diabetes type II Paternal Grandmother   . Diabetes Paternal Grandmother   . Obesity Son   . High blood pressure (Hypertension) Son   . Asthma Son   . Obesity Son   . No Known Problems Daughter   . Alcohol abuse Paternal Grandfather     SOCIAL HISTORY: Social History          Socioeconomic History  . Marital status: Married    Spouse name: Not on file  . Number of children: Not on file  . Years of education: Not on file  . Highest education level: Not on file  Occupational History  . Not on file  Social Needs  . Financial resource strain: Not on file  . Food insecurity:    Worry: Not on  file    Inability: Not on file  . Transportation needs:    Medical: Not on file    Non-medical: Not on file  Tobacco Use  . Smoking status: Former Smoker    Packs/day: 2.00    Years: 3.00    Pack years: 6.00    Types: Cigarettes    Last attempt to quit: 11/03/1975    Years since quitting: 43.5  . Smokeless tobacco: Never Used  Substance and Sexual Activity  . Alcohol use: Yes    Alcohol/week: 0.0 standard drinks    Comment: 1 glass wine 1-2 times a month.  . Drug use: No  . Sexual activity: Yes    Partners: Male    Birth control/protection: Surgical  Other Topics Concern  . Not on file  Social History Narrative   She lives with her husband, married 02/17/1975, three grown children. 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 Exercise: active at work. Wears seatbelt, sunscreen. Dentist: yes. Calcium in diet: yes. No religious beliefs affecting health care.       Advanced Directives: yes. Has a living will and HCPOA.       PHYSICAL EXAM:    Vitals:   05/09/19 1528  BP: 111/74  Pulse: 84   Body mass index is 38.97 kg/m. Weight: 99.8 kg (220 lb)  GENERAL: Alert, active, oriented x3  HEENT: Pupils equal reactive to light. Extraocular movements are intact. Sclera clear. Palpebral conjunctiva normal red color.Pharynx clear.  NECK: Supple with no palpable mass and no adenopathy.  LUNGS: Sound clear with no rales rhonchi or wheezes.  HEART: Regular rhythm S1 and S2 without murmur.  BREAST:Right breast wound dry and clean, no erythema, no secretions. Right axillary scar healing well.   ABDOMEN: Soft and depressible, nontender with no palpable mass, no hepatomegaly.  EXTREMITIES: Well-developed well-nourished symmetrical with no dependent edema.  NEUROLOGICAL: Awake alert oriented, facial expression symmetrical, moving all extremities.  REVIEW OF DATA: I have reviewed the  following data today:      Office Visit on 04/06/2019  Component Date Value  . Sodium 04/20/2019 143   . Potassium 04/20/2019 4.3   . Chloride 04/20/2019 111*  . Carbon Dioxide (CO2) 04/20/2019 25   . Urea Nitrogen (BUN) 04/20/2019 16   . Creatinine 04/20/2019 0.9   . Glucose 04/20/2019 91   . Calcium 04/20/2019 8.8   . AST (Aspartate Aminotran* 04/20/2019 23   . ALT (Alanine Aminotransf* 04/20/2019 25   . Bilirubin, Total 04/20/2019 0.6   . Alk Phos (Alkaline Phosp* 04/20/2019 78   . Albumin 04/20/2019 3.6   . Protein, Total 04/20/2019 6.3   . Anion Gap 04/20/2019 7   . BUN/CREA Ratio 04/20/2019 18   . Glomerular Filtration Ra* 04/20/2019 67   . Cholesterol, Total 04/20/2019 137   . LDL Calculated 04/20/2019 69   . HDL 04/20/2019 42   . Triglyceride 04/20/2019 128   . Hemoglobin A1C 04/06/2019 5.6   . Average Blood Glucose (C* 04/06/2019 111    ASSESSMENT: Ms.Bowersis a 63 y.o.femalepresenting for re excision of positive margin of DCIS  Patient was oriented again about the pathology results. Recommendation of re excision was done. Patient understood and agreed to proceed.   Malignant neoplasm of upper-outer quadrant of right breast in female, estrogen receptor positive (CMS-HCC) [C50.411, Z17.0]  PLAN: 1. Partial mastectomy (re excision) right breast (25852)  Patient verbalized understanding, all questions were answered, and were agreeable with the plan outlined above.    Herbert Pun, MD  Electronically signed by Herbert Pun, MD

## 2019-05-24 NOTE — Anesthesia Procedure Notes (Signed)
Procedure Name: LMA Insertion Date/Time: 05/24/2019 9:10 AM Performed by: Philbert Riser, CRNA Pre-anesthesia Checklist: Patient identified, Emergency Drugs available, Suction available, Patient being monitored and Timeout performed Patient Re-evaluated:Patient Re-evaluated prior to induction Oxygen Delivery Method: Circle system utilized and Simple face mask Preoxygenation: Pre-oxygenation with 100% oxygen Induction Type: IV induction Ventilation: Mask ventilation without difficulty LMA Size: 3.5 Number of attempts: 1 Placement Confirmation: breath sounds checked- equal and bilateral and positive ETCO2 Dental Injury: Teeth and Oropharynx as per pre-operative assessment

## 2019-05-24 NOTE — Anesthesia Post-op Follow-up Note (Signed)
Anesthesia QCDR form completed.        

## 2019-05-24 NOTE — Discharge Instructions (Signed)
°  Diet: Resume home heart healthy regular diet.  ° °Activity: Increase activity as tolerated. Light activity and walking are encouraged. Do not drive or drink alcohol if taking narcotic pain medications. ° °Wound care: May shower with soapy water and pat dry (do not rub incisions), but no baths or submerging incision underwater until follow-up. (no swimming)  ° °Medications: Resume all home medications. For mild to moderate pain: acetaminophen (Tylenol) or ibuprofen (if no kidney disease). Combining Tylenol with alcohol can substantially increase your risk of causing liver disease. Narcotic pain medications, if prescribed, can be used for severe pain, though may cause nausea, constipation, and drowsiness. Do not combine Tylenol and Norco within a 6 hour period as Norco contains Tylenol. If you do not need the narcotic pain medication, you do not need to fill the prescription. ° °Call office ([phone]) at any time if any questions, worsening pain, fevers/chills, bleeding, drainage from incision site, or other concerns. ° °AMBULATORY SURGERY  °DISCHARGE INSTRUCTIONS ° ° °1) The drugs that you were given will stay in your system until tomorrow so for the next 24 hours you should not: ° °A) Drive an automobile °B) Make any legal decisions °C) Drink any alcoholic beverage ° ° °2) You may resume regular meals tomorrow.  Today it is better to start with liquids and gradually work up to solid foods. ° °You may eat anything you prefer, but it is better to start with liquids, then soup and crackers, and gradually work up to solid foods. ° ° °3) Please notify your doctor immediately if you have any unusual bleeding, trouble breathing, redness and pain at the surgery site, drainage, fever, or pain not relieved by medication. ° ° ° °4) Additional Instructions: ° ° ° ° ° ° ° °Please contact your physician with any problems or Same Day Surgery at [phone], Monday through Friday 6 am to 4 pm, or  at [facility]  number at [phone]. °

## 2019-05-24 NOTE — Anesthesia Preprocedure Evaluation (Signed)
Anesthesia Evaluation  Patient identified by MRN, date of birth, ID band Patient awake    Reviewed: Allergy & Precautions, NPO status , Patient's Chart, lab work & pertinent test results  History of Anesthesia Complications Negative for: history of anesthetic complications  Airway Mallampati: II       Dental   Pulmonary asthma , neg sleep apnea, neg COPD, former smoker,           Cardiovascular hypertension, Pt. on medications (-) Past MI and (-) CHF (-) dysrhythmias (-) Valvular Problems/Murmurs     Neuro/Psych neg Seizures    GI/Hepatic Neg liver ROS, GERD  Medicated and Controlled,  Endo/Other  neg diabetes  Renal/GU negative Renal ROS     Musculoskeletal   Abdominal   Peds  Hematology   Anesthesia Other Findings   Reproductive/Obstetrics                             Anesthesia Physical Anesthesia Plan  ASA: III  Anesthesia Plan: General   Post-op Pain Management:    Induction:   PONV Risk Score and Plan: 3 and Dexamethasone, Ondansetron and Midazolam  Airway Management Planned: Oral ETT  Additional Equipment:   Intra-op Plan:   Post-operative Plan:   Informed Consent: I have reviewed the patients History and Physical, chart, labs and discussed the procedure including the risks, benefits and alternatives for the proposed anesthesia with the patient or authorized representative who has indicated his/her understanding and acceptance.       Plan Discussed with:   Anesthesia Plan Comments:         Anesthesia Quick Evaluation

## 2019-05-24 NOTE — Addendum Note (Signed)
Addendum  created 05/24/19 1509 by Hedda Slade, CRNA   Charge Capture section accepted

## 2019-05-24 NOTE — Op Note (Signed)
Preoperative diagnosis: Right breast DCIS with positive margins  Postoperative diagnosis: Right breast DCIS with positive margins.   Procedure: Right partial mastectomy for re excision of lateral, posterior, and anterior margins.                       Anesthesia: GETA  Surgeon: Dr. Windell Moment  Wound Classification: Clean  Indications: Patient is a 63 y.o. female with a nonpalpable right breast calficications noted on mammography with core biopsy demonstrating DCIS. Partial mastectomy a week ago with Intra-Op consult to pathology showed mass and bleeding the anterior margin.  At the moment reexcision of the anterior margin was done.  Final pathology came with high-grade DCIS with positive margins on the lateral margin and less than 0.5 mm to the posterior margin.  Findings: 1. Pathology call refers gross examination of margins was negative for any gross mass 3. No other palpable mass or lymph node identified.   Description of procedure:  The patient was taken to the operating room and placed supine on the operating table, and after general anesthesia the right chest was prepped and draped in the usual sterile fashion. A time-out was completed verifying correct patient, procedure, site, positioning, and implant(s) and/or special equipment prior to beginning this procedure.  Previous wound was opened with a #15 blade.  The cavity was accessed and serous fluid was suctioned.  Cavity was irrigated with water.  Initially the lateral margin was folded with Allis clamp for retraction.  Dissection was carried down inferiorly.  Complete excision of the lateral aspect of the cavity was done.  The specimen was oriented and sent to pathology for gross examination of margins.  The same technique was used to re-excise the posterior margin and the anterior margin.  Gross evaluation of the lateral margin and posterior margin by pathology was reported as negative for any gross malignancy.  Due to the  appearance of the anterior margin I decided to excise the anterior margin.  The wound was irrigated. Hemostasis was checked. The wound was closed with interrupted sutures of 4-0 Vicryl and a subcuticular suture of Monocryl 3-0. No attempt was made to close the dead space. A dressing was applied.   Specimen: Right Breast lateral margin                      Right breast posterior margin                     Right breast anterior margin  Complications: None  Estimated Blood Loss: 5 mL

## 2019-05-24 NOTE — Transfer of Care (Signed)
Immediate Anesthesia Transfer of Care Note  Patient: Jane Gardner  Procedure(s) Performed: MASTECTOMY PARTIAL, REEXCISION OF RIGHT BREAST (Right )  Patient Location: PACU  Anesthesia Type:General  Level of Consciousness: awake  Airway & Oxygen Therapy: Patient Spontanous Breathing and Patient connected to face mask oxygen  Post-op Assessment: Report given to RN and Post -op Vital signs reviewed and stable  Post vital signs: Reviewed and stable  Last Vitals:  Vitals Value Taken Time  BP    Temp    Pulse 80 05/24/19 1040  Resp 12 05/24/19 1040  SpO2 99 % 05/24/19 1040  Vitals shown include unvalidated device data.  Last Pain:  Vitals:   05/24/19 0815  TempSrc: Oral  PainSc: 0-No pain         Complications: No apparent anesthesia complications

## 2019-05-24 NOTE — Anesthesia Postprocedure Evaluation (Signed)
Anesthesia Post Note  Patient: Jane Gardner  Procedure(s) Performed: MASTECTOMY PARTIAL, REEXCISION OF RIGHT BREAST (Right )  Patient location during evaluation: PACU Anesthesia Type: General Level of consciousness: awake and alert Pain management: pain level controlled Vital Signs Assessment: post-procedure vital signs reviewed and stable Respiratory status: spontaneous breathing and respiratory function stable Cardiovascular status: stable Anesthetic complications: no     Last Vitals:  Vitals:   05/24/19 1130 05/24/19 1135  BP: 118/82   Pulse: 65 61  Resp: (!) 24 13  Temp:  (!) 36 C  SpO2: 93% 100%    Last Pain:  Vitals:   05/24/19 1135  TempSrc:   PainSc: 0-No pain                 KEPHART,WILLIAM K

## 2019-05-25 ENCOUNTER — Other Ambulatory Visit: Admission: RE | Admit: 2019-05-25 | Payer: BC Managed Care – PPO | Source: Ambulatory Visit

## 2019-05-26 LAB — SURGICAL PATHOLOGY

## 2019-06-01 ENCOUNTER — Inpatient Hospital Stay: Payer: BC Managed Care – PPO | Admitting: Oncology

## 2019-06-01 ENCOUNTER — Encounter: Payer: Self-pay | Admitting: Oncology

## 2019-06-01 ENCOUNTER — Other Ambulatory Visit: Payer: Self-pay

## 2019-06-01 VITALS — BP 137/90 | HR 56 | Temp 96.1°F | Ht 63.0 in | Wt 220.2 lb

## 2019-06-01 DIAGNOSIS — Z800 Family history of malignant neoplasm of digestive organs: Secondary | ICD-10-CM | POA: Diagnosis not present

## 2019-06-01 DIAGNOSIS — Z809 Family history of malignant neoplasm, unspecified: Secondary | ICD-10-CM

## 2019-06-01 DIAGNOSIS — D0511 Intraductal carcinoma in situ of right breast: Secondary | ICD-10-CM

## 2019-06-01 DIAGNOSIS — Z170 Estrogen receptor positive status [ER+]: Secondary | ICD-10-CM

## 2019-06-01 DIAGNOSIS — Z803 Family history of malignant neoplasm of breast: Secondary | ICD-10-CM | POA: Diagnosis not present

## 2019-06-01 NOTE — Progress Notes (Signed)
Hematology/Oncology Consult note [facility] Telephone:([phone] [phone]   Patient Care Team: Hortencia Pilar, MD as PCP - General (Family Medicine)  REFERRING PROVIDER: Hortencia Pilar, MD  CHIEF COMPLAINTS/REASON FOR VISIT:  Evaluation of DCIS  HISTORY OF PRESENTING ILLNESS:  Jane Gardner is a  63 y.o.  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  Estrogen receptor status, >90%  Nipple discharge: Denies Family history: Mother had history of breast cancer and bladder cancer.  Alive.  Maternal aunt has pancreatic cancer.  Maternal uncle has stomach cancer.  Maternal uncle has lung cancer. OCP use: Previous use of OCP Estrogen and progesterone therapy: Menopause in 47s, she was on hormone replacement for a few years. History of radiation to chest: denies.  No previous breast biopsy.  INTERVAL HISTORY Jane Gardner is a 63 y.o. female who has above history reviewed by me today presents for follow up visit for management of DCIS Problems and complaints are listed below: 05/15/2019 patient underwent right lumpectomy with sentinel lymph node biopsy. Pathology showed high-grade DCIS, 74m, grade 3, posterior margin was positive for DCIS.  Regional lymph nodes uninvolved by tumor cells. pTis N0 05/24/2019 reexcision negative for DCIS. Patient reports she is doing well today.  No concerns of her lumpectomy surgical scar.  Healing well. No new complaints.  Review of Systems  Constitutional:  Negative for appetite change, chills, fatigue and fever.  HENT:   Negative for hearing loss and voice change.   Eyes: Negative for eye problems.  Respiratory: Negative for chest tightness and cough.   Cardiovascular: Negative for chest pain.  Gastrointestinal: Negative for abdominal distention, abdominal pain and blood in stool.  Endocrine: Negative for hot flashes.  Genitourinary: Negative for difficulty urinating and frequency.   Musculoskeletal: Negative for arthralgias.  Skin: Negative for itching and rash.  Neurological: Negative for extremity weakness.  Hematological: Negative for adenopathy.  Psychiatric/Behavioral: Negative for confusion.    MEDICAL HISTORY:  Past Medical History:  Diagnosis Date  . Asthma   . Fibrocystic breast   . GERD (gastroesophageal reflux disease)   . Hyperlipemia   . Hypertension   . Pneumonia     SURGICAL HISTORY: Past Surgical History:  Procedure Laterality Date  . BREAST BIOPSY Right 05/02/2019   right breast stereo bx of calcs, coil clip, path pending  . BREAST CYST ASPIRATION     ? side neg  . MASTECTOMY, PARTIAL Right 05/24/2019   Procedure: MASTECTOMY PARTIAL, REEXCISION OF RIGHT BREAST;  Surgeon: CHerbert Pun MD;  Location: [facility] ORS;  Service: General;  Laterality: Right;  . PARTIAL MASTECTOMY WITH NEEDLE LOCALIZATION Right 05/15/2019   Procedure: PARTIAL MASTECTOMY WITH NEEDLE LOCALIZATION AND SENTINEL NODE, RIGHT;  Surgeon: CHerbert Pun MD;  Location: [facility] ORS;  Service: General;  Laterality: Right;  . TONSILLECTOMY    . TONSILLECTOMY AND ADENOIDECTOMY    . TUBAL LIGATION      SOCIAL HISTORY: Social History   Socioeconomic History  . Marital status: Married    Spouse name: Not on file  . Number of children: Not on file  . Years of education:  Not on file  . Highest education level: Not on file  Occupational History  . Not on file  Social Needs  . Financial resource strain: Not on file  . Food insecurity     Worry: Not on file    Inability: Not on file  . Transportation needs    Medical: Not on file    Non-medical: Not on file  Tobacco Use  . Smoking status: Former Smoker    Packs/day: 1.00    Years: 3.00    Pack years: 3.00    Types: Cigarettes    Quit date: 1977    Years since quitting: 43.6  . Smokeless tobacco: Never Used  Substance and Sexual Activity  . Alcohol use: Yes    Comment: rare wine  . Drug use: No  . Sexual activity: Not on file  Lifestyle  . Physical activity    Days per week: Not on file    Minutes per session: Not on file  . Stress: Not on file  Relationships  . Social Herbalist on phone: Not on file    Gets together: Not on file    Attends religious service: Not on file    Active member of club or organization: Not on file    Attends meetings of clubs or organizations: Not on file    Relationship status: Not on file  . Intimate partner violence    Fear of current or ex partner: Not on file    Emotionally abused: Not on file    Physically abused: Not on file    Forced sexual activity: Not on file  Other Topics Concern  . Not on file  Social History Narrative  . Not on file    FAMILY HISTORY: Family History  Problem Relation Age of Onset  . Hypertension Mother   . Breast cancer Mother 89  . Hyperlipidemia Mother   . Bladder Cancer Mother   . Diabetes Father   . Pancreatic cancer Maternal Aunt   . Stomach cancer Maternal Uncle   . Diabetes Paternal Grandmother   . Lung cancer Maternal Uncle     ALLERGIES:  is allergic to hyoscyamine sulfate; latex; and other.  MEDICATIONS:  Current Outpatient Medications  Medication Sig Dispense Refill  . albuterol (PROVENTIL HFA;VENTOLIN HFA) 108 (90 Base) MCG/ACT inhaler Inhale 1-2 puffs into the lungs every 6 (six) hours as needed for wheezing or shortness of breath. 1 Inhaler 0  . Ascorbic Acid (VITAMIN C) 1000 MG tablet Take 1 tablet by mouth daily.    Marland Kitchen atorvastatin (LIPITOR) 40 MG  tablet Take 40 mg by mouth daily.    . Biotin 5000 MCG CAPS Take by mouth.    . cetirizine (ZYRTEC) 10 MG tablet Take 10 mg by mouth daily.    . Cholecalciferol (VITAMIN D3) 25 MCG (1000 UT) CAPS Take by mouth.    . co-enzyme Q-10 50 MG capsule Take 100 mg by mouth daily.    Marland Kitchen ELDERBERRY PO Take by mouth.    . famotidine (PEPCID) 20 MG tablet Take by mouth.    . fluticasone (FLONASE) 50 MCG/ACT nasal spray Place 2 sprays into both nostrils daily. 16 g 2  . lisinopril (ZESTRIL) 10 MG tablet Take 10 mg by mouth daily.    . Misc Natural Products (GLUCOSAMINE CHONDROITIN ADV PO) Take by mouth.     No current facility-administered medications for this visit.      PHYSICAL EXAMINATION: ECOG PERFORMANCE STATUS: 0 - Asymptomatic  Vitals:   06/01/19 0909  BP: 137/90  Pulse: (!) 56  Temp: (!) 96.1 F (35.6 C)   Filed Weights   06/01/19 0909  Weight: 220 lb 4 oz (99.9 kg)    Physical Exam Constitutional:      General: She is not in acute distress. HENT:     Head: Normocephalic and atraumatic.  Eyes:     General: No scleral icterus.    Pupils: Pupils are equal, round, and reactive to light.  Neck:     Musculoskeletal: Normal range of motion and neck supple.  Cardiovascular:     Rate and Rhythm: Normal rate and regular rhythm.     Heart sounds: Normal heart sounds.  Pulmonary:     Effort: Pulmonary effort is normal. No respiratory distress.     Breath sounds: No wheezing.  Abdominal:     General: Bowel sounds are normal. There is no distension.     Palpations: Abdomen is soft. There is no mass.     Tenderness: There is no abdominal tenderness.  Musculoskeletal: Normal range of motion.        General: No deformity.  Skin:    General: Skin is warm and dry.     Findings: No erythema or rash.  Neurological:     Mental Status: She is alert and oriented to person, place, and time.     Cranial Nerves: No cranial nerve deficit.     Coordination: Coordination normal.  Psychiatric:         Behavior: Behavior normal.        Thought Content: Thought content normal.   Right breast status post lumpectomy and sentinel lymph node biopsy.  No erythema or discharge of surgical site.  Healing well.   LABORATORY DATA:  I have reviewed the data as listed No results found for: WBC, HGB, HCT, MCV, PLT No results for input(s): NA, K, CL, CO2, GLUCOSE, BUN, CREATININE, CALCIUM, GFRNONAA, GFRAA, PROT, ALBUMIN, AST, ALT, ALKPHOS, BILITOT, BILIDIR, IBILI in the last 8760 hours. Iron/TIBC/Ferritin/ %Sat No results found for: IRON, TIBC, FERRITIN, IRONPCTSAT   Patient had labs done at [facility] clinic on 05/09/2019.  Labs are reviewed.  RADIOGRAPHIC STUDIES: I have personally reviewed the radiological images as listed and agreed with the findings in the report. Nm Sentinel Node Injection  Result Date: 05/15/2019 CLINICAL DATA:  Right breast cancer. EXAM: NUCLEAR MEDICINE BREAST LYMPHOSCINTIGRAPHY TECHNIQUE: Intradermal injection of radiopharmaceutical was performed at the 12 o'clock, 3 o'clock, 6 o'clock, and 9 o'clock positions around the right nipple. The patient was then sent to the operating room where the sentinel node(s) were identified and removed by the surgeon. RADIOPHARMACEUTICALS:  Total of 1 mCi Millipore-filtered Technetium-37msulfur colloid, injected in four aliquots of 0.25 mCi each. IMPRESSION: Uncomplicated intradermal injection of a total of 1 mCi Technetium-927mulfur colloid for purposes of sentinel node identification. Electronically Signed   By: HeKathreen Devoid On: 05/15/2019 11:46   Mm Breast Surgical Specimen  Result Date: 05/15/2019 CLINICAL DATA:  Evaluate specimen EXAM: SPECIMEN RADIOGRAPH OF THE RIGHT BREAST COMPARISON:  Previous exam(s). FINDINGS: Status post excision of the right breast. The wire tips and biopsy marker clip are present and are marked for pathology. IMPRESSION: Specimen radiograph of the right breast. Electronically Signed   By: DaDorise BullionII  M.D   On: 05/15/2019 14:08   Mm Rt Plc Breast Loc Dev   1st Lesion  Inc Mammo Guide  Result Date: 05/15/2019 CLINICAL DATA:  Patient with  RIGHT breast DCIS scheduled for breast conservation surgery requiring preoperative needle localization. EXAM: NEEDLE LOCALIZATION OF THE RIGHT BREAST WITH MAMMO GUIDANCE COMPARISON:  Previous exams. FINDINGS: Patient presents for needle localization prior to breast conservation surgery. I met with the patient and we discussed the procedure of needle localization including benefits and alternatives. We discussed the high likelihood of a successful procedure. We discussed the risks of the procedure, including infection, bleeding, tissue injury, and further surgery. Informed, written consent was given. The usual time-out protocol was performed immediately prior to the procedure. Using mammographic guidance, sterile technique, 1% lidocaine and a 7 cm modified Kopans needle and a 9 cm modified Kopan's needle, the calcifications within the upper RIGHT breast were localized in a bracketed fashion using superior approach. The images were marked for Dr. Herbert Pun. IMPRESSION: Needle localization RIGHT breast. Calcifications were bracketed with 2 wires. No apparent complications. Electronically Signed   By: Franki Cabot M.D.   On: 05/15/2019 12:36    ASSESSMENT & PLAN:  1. Ductal carcinoma in situ (DCIS) of right breast   2. Family history of cancer    Pathology report reviewed and discussed with patient. High-grade DCIS, ER positive No need for adjuvant chemotherapy.  Discussed with patient. Recommend patient to establish care with radiation oncology for adjuvant radiation. Following radiation I recommend patient to start aromatase inhibitor. Also recommend patient to have a baseline bone density done. Rationale and side effects of aromatase inhibitor discussed in details with patient.  #Patient has family history of breast cancer, pancreatic cancer, stomach  cancer. Recommend patient to consider about genetic testing.  She will update me if she wants to proceed with genetic testing.   All questions were answered. The patient knows to call the clinic with any problems questions or concerns.  Return of visit: Patient will notify me when she has information of radiation dates.  We will schedule her to follow-up 2 weeks after she finishes radiation.  Also will need to schedule her to have a bone density scan done prior to next visit. We spent sufficient time to discuss many aspect of care, questions were answered to patient's satisfaction. Total face to face encounter time for this patient visit was 25 min. >50% of the time was  spent in counseling and coordination of care.    Earlie Server, MD, PhD Hematology Oncology [place] [facility] 06/01/2019

## 2019-06-06 ENCOUNTER — Other Ambulatory Visit: Payer: Self-pay

## 2019-06-07 ENCOUNTER — Encounter: Payer: Self-pay | Admitting: Radiation Oncology

## 2019-06-07 ENCOUNTER — Ambulatory Visit
Admission: RE | Admit: 2019-06-07 | Discharge: 2019-06-07 | Disposition: A | Discharge status: Home / Self Care | Payer: Medicare Other | Source: Ambulatory Visit | Attending: Radiation Oncology | Admitting: Radiation Oncology

## 2019-06-07 ENCOUNTER — Other Ambulatory Visit: Payer: Self-pay

## 2019-06-07 VITALS — BP 140/84 | HR 66 | Temp 97.1°F | Resp 18 | Wt 221.5 lb

## 2019-06-07 DIAGNOSIS — E785 Hyperlipidemia, unspecified: Secondary | ICD-10-CM | POA: Insufficient documentation

## 2019-06-07 DIAGNOSIS — Z9011 Acquired absence of right breast and nipple: Secondary | ICD-10-CM | POA: Insufficient documentation

## 2019-06-07 DIAGNOSIS — Z800 Family history of malignant neoplasm of digestive organs: Secondary | ICD-10-CM | POA: Insufficient documentation

## 2019-06-07 DIAGNOSIS — Z79899 Other long term (current) drug therapy: Secondary | ICD-10-CM | POA: Diagnosis not present

## 2019-06-07 DIAGNOSIS — D0511 Intraductal carcinoma in situ of right breast: Secondary | ICD-10-CM | POA: Diagnosis not present

## 2019-06-07 DIAGNOSIS — K219 Gastro-esophageal reflux disease without esophagitis: Secondary | ICD-10-CM | POA: Diagnosis not present

## 2019-06-07 DIAGNOSIS — Z87891 Personal history of nicotine dependence: Secondary | ICD-10-CM | POA: Diagnosis not present

## 2019-06-07 DIAGNOSIS — Z8052 Family history of malignant neoplasm of bladder: Secondary | ICD-10-CM | POA: Diagnosis not present

## 2019-06-07 DIAGNOSIS — Z803 Family history of malignant neoplasm of breast: Secondary | ICD-10-CM | POA: Insufficient documentation

## 2019-06-07 DIAGNOSIS — J45909 Unspecified asthma, uncomplicated: Secondary | ICD-10-CM | POA: Insufficient documentation

## 2019-06-07 DIAGNOSIS — I10 Essential (primary) hypertension: Secondary | ICD-10-CM | POA: Insufficient documentation

## 2019-06-07 DIAGNOSIS — Z170 Estrogen receptor positive status [ER+]: Secondary | ICD-10-CM | POA: Diagnosis not present

## 2019-06-07 NOTE — Consult Note (Signed)
NEW PATIENT EVALUATION  Name: Jane Gardner  MRN: [id number]  Date:   06/07/2019     DOB: 18-Oct-1956   This 63 y.o. female patient presents to the clinic for initial evaluation of stage 0 (Tis N0 M0) ductal carcinoma in situ ER PR positive status post wide local excision and sentinel node biopsy of the right breast..  REFERRING PHYSICIAN: Hortencia Pilar, MD  CHIEF COMPLAINT:  Chief Complaint  Patient presents with  . Breast Cancer    Initial consultation    DIAGNOSIS: The encounter diagnosis was Ductal carcinoma in situ (DCIS) of right breast.   PREVIOUS INVESTIGATIONS:  Pathology report reviewed Mammogram and ultrasound reviewed Clinical notes reviewed  HPI: Patient is a 63 year old female who presented with an abnormal mammogram of her right breast back in June 2020 showing calcifications in the right upper outer quadrant.  This was confirmed both on repeat diagnostic mammogram as well as ultrasound showing a 2.3 x 0.4 area of punctated calcifications in a linear distribution concerning for malignancy.  She underwent biopsy showing high-grade ductal carcinoma in situ with expansile comedonecrosis and associated calcific calcifications.  Tumor was ER positive.  She underwent wide local excision and sentinel node biopsy.  Tumor measured 1.6 cm was high-grade ductal carcinoma in situ.  Comedonecrosis was present.  There was a positive posterior margin less than 0.5 mm.  2 sentinel lymph nodes were negative for malignancy.  She underwent reexcision with no evidence of residual disease.  She is seen today in routine follow-up breast is still somewhat tender.  She specifically denies chest tenderness cough or bone pain.  PLANNED TREATMENT REGIMEN: Right whole breast radiation  PAST MEDICAL HISTORY:  has a past medical history of Asthma, Fibrocystic breast, GERD (gastroesophageal reflux disease), Hyperlipemia, Hypertension, and Pneumonia.    PAST SURGICAL HISTORY:  Past Surgical History:   Procedure Laterality Date  . BREAST BIOPSY Right 05/02/2019   right breast stereo bx of calcs, coil clip, path pending  . BREAST CYST ASPIRATION     ? side neg  . MASTECTOMY, PARTIAL Right 05/24/2019   Procedure: MASTECTOMY PARTIAL, REEXCISION OF RIGHT BREAST;  Surgeon: Herbert Pun, MD;  Location: [facility] ORS;  Service: General;  Laterality: Right;  . PARTIAL MASTECTOMY WITH NEEDLE LOCALIZATION Right 05/15/2019   Procedure: PARTIAL MASTECTOMY WITH NEEDLE LOCALIZATION AND SENTINEL NODE, RIGHT;  Surgeon: Herbert Pun, MD;  Location: [facility] ORS;  Service: General;  Laterality: Right;  . TONSILLECTOMY    . TONSILLECTOMY AND ADENOIDECTOMY    . TUBAL LIGATION      FAMILY HISTORY: family history includes Bladder Cancer in her mother; Breast cancer (age of onset: 37) in her mother; Diabetes in her father and paternal grandmother; Hyperlipidemia in her mother; Hypertension in her mother; Lung cancer in her maternal uncle; Pancreatic cancer in her maternal aunt; Stomach cancer in her maternal uncle.  SOCIAL HISTORY:  reports that she quit smoking about 43 years ago. Her smoking use included cigarettes. She has a 3.00 pack-year smoking history. She has never used smokeless tobacco. She reports current alcohol use. She reports that she does not use drugs.  ALLERGIES: Hyoscyamine sulfate, Latex, and Other  MEDICATIONS:  Current Outpatient Medications  Medication Sig Dispense Refill  . albuterol (PROVENTIL HFA;VENTOLIN HFA) 108 (90 Base) MCG/ACT inhaler Inhale 1-2 puffs into the lungs every 6 (six) hours as needed for wheezing or shortness of breath. 1 Inhaler 0  . Ascorbic Acid (VITAMIN C) 1000 MG tablet Take 1 tablet by mouth daily.    Marland Kitchen  atorvastatin (LIPITOR) 40 MG tablet Take 40 mg by mouth daily.    . Biotin 5000 MCG CAPS Take by mouth.    . cetirizine (ZYRTEC) 10 MG tablet Take 10 mg by mouth daily.    . Cholecalciferol (VITAMIN D3) 25 MCG (1000 UT) CAPS Take by mouth.    .  co-enzyme Q-10 50 MG capsule Take 100 mg by mouth daily.    Marland Kitchen ELDERBERRY PO Take by mouth.    . famotidine (PEPCID) 20 MG tablet Take by mouth.    . fluticasone (FLONASE) 50 MCG/ACT nasal spray Place 2 sprays into both nostrils daily. 16 g 2  . lisinopril (ZESTRIL) 10 MG tablet Take 10 mg by mouth daily.    . Misc Natural Products (GLUCOSAMINE CHONDROITIN ADV PO) Take by mouth.     No current facility-administered medications for this encounter.     ECOG PERFORMANCE STATUS:  0 - Asymptomatic  REVIEW OF SYSTEMS: Patient denies any weight loss, fatigue, weakness, fever, chills or night sweats. Patient denies any loss of vision, blurred vision. Patient denies any ringing  of the ears or hearing loss. No irregular heartbeat. Patient denies heart murmur or history of fainting. Patient denies any chest pain or pain radiating to her upper extremities. Patient denies any shortness of breath, difficulty breathing at night, cough or hemoptysis. Patient denies any swelling in the lower legs. Patient denies any nausea vomiting, vomiting of blood, or coffee ground material in the vomitus. Patient denies any stomach pain. Patient states has had normal bowel movements no significant constipation or diarrhea. Patient denies any dysuria, hematuria or significant nocturia. Patient denies any problems walking, swelling in the joints or loss of balance. Patient denies any skin changes, loss of hair or loss of weight. Patient denies any excessive worrying or anxiety or significant depression. Patient denies any problems with insomnia. Patient denies excessive thirst, polyuria, polydipsia. Patient denies any swollen glands, patient denies easy bruising or easy bleeding. Patient denies any recent infections, allergies or URI. Patient "s visual fields have not changed significantly in recent time.   PHYSICAL EXAM: BP 140/84 (BP Location: Left Arm, Patient Position: Sitting)   Pulse 66   Temp (!) 97.1 F (36.2 C)  (Tympanic)   Resp 18   Wt 221 lb 8 oz (100.5 kg)   BMI 39.24 kg/m  Right breast is wide local excision in approximately the 10 o'clock position which is healing well.  She also has a small axillary scar which is well-healed no dominant mass or nodularity is noted in either breast in 2 positions examined.  No axillary or supraclavicular adenopathy is appreciated.  Well-developed well-nourished patient in NAD. HEENT reveals PERLA, EOMI, discs not visualized.  Oral cavity is clear. No oral mucosal lesions are identified. Neck is clear without evidence of cervical or supraclavicular adenopathy. Lungs are clear to A&P. Cardiac examination is essentially unremarkable with regular rate and rhythm without murmur rub or thrill. Abdomen is benign with no organomegaly or masses noted. Motor sensory and DTR levels are equal and symmetric in the upper and lower extremities. Cranial nerves II through XII are grossly intact. Proprioception is intact. No peripheral adenopathy or edema is identified. No motor or sensory levels are noted. Crude visual fields are within normal range.  LABORATORY DATA: Pathology report reviewed    RADIOLOGY RESULTS: Mammogram and ultrasound reviewed   IMPRESSION: Stage 0 ductal carcinoma in situ ER PR positive status post wide local excision and sentinel node biopsy in 63 year old female  PLAN: At this time based on the patient's large pendulous breast would not favor hypofractionated course of treatment.  I would plan on delivering 5040 cGy in 28 fractions to her right breast.  Would also boost her scar another 1400 cGy using electron-beam.  Risks and benefits of treatment including skin reaction fatigue alteration of blood counts possible occlusion of superficial lung all were described in detail to the patient.  She seems to comprehend my treatment plan well.  I have personally set up and ordered CT simulation for early next week.  Patient also will benefit from antiestrogen therapy  after radiation.  Patient knows comprehends my treatment plan well.  I would like to take this opportunity to thank you for allowing me to participate in the care of your patient.Noreene Filbert, MD

## 2019-06-08 DIAGNOSIS — D0511 Intraductal carcinoma in situ of right breast: Secondary | ICD-10-CM | POA: Insufficient documentation

## 2019-06-08 HISTORY — DX: Intraductal carcinoma in situ of right breast: D05.11

## 2019-06-12 ENCOUNTER — Other Ambulatory Visit: Payer: Self-pay

## 2019-06-12 ENCOUNTER — Ambulatory Visit
Admission: RE | Admit: 2019-06-12 | Discharge: 2019-06-12 | Disposition: A | Discharge status: Home / Self Care | Payer: BC Managed Care – PPO | Source: Ambulatory Visit | Attending: Radiation Oncology | Admitting: Radiation Oncology

## 2019-06-12 DIAGNOSIS — Z170 Estrogen receptor positive status [ER+]: Secondary | ICD-10-CM | POA: Diagnosis not present

## 2019-06-12 DIAGNOSIS — D0511 Intraductal carcinoma in situ of right breast: Secondary | ICD-10-CM | POA: Diagnosis not present

## 2019-06-12 DIAGNOSIS — Z510 Encounter for antineoplastic radiation therapy: Secondary | ICD-10-CM | POA: Insufficient documentation

## 2019-06-13 DIAGNOSIS — D0511 Intraductal carcinoma in situ of right breast: Secondary | ICD-10-CM | POA: Diagnosis not present

## 2019-06-15 ENCOUNTER — Other Ambulatory Visit: Payer: Self-pay | Admitting: *Deleted

## 2019-06-15 DIAGNOSIS — D0511 Intraductal carcinoma in situ of right breast: Secondary | ICD-10-CM

## 2019-06-19 ENCOUNTER — Ambulatory Visit
Admission: RE | Admit: 2019-06-19 | Discharge: 2019-06-19 | Disposition: A | Discharge status: Home / Self Care | Payer: BC Managed Care – PPO | Source: Ambulatory Visit | Attending: Radiation Oncology | Admitting: Radiation Oncology

## 2019-06-19 DIAGNOSIS — D0511 Intraductal carcinoma in situ of right breast: Secondary | ICD-10-CM | POA: Diagnosis not present

## 2019-06-20 ENCOUNTER — Other Ambulatory Visit: Payer: Self-pay

## 2019-06-20 ENCOUNTER — Ambulatory Visit
Admission: RE | Admit: 2019-06-20 | Discharge: 2019-06-20 | Disposition: A | Discharge status: Home / Self Care | Payer: BC Managed Care – PPO | Source: Ambulatory Visit | Attending: Radiation Oncology | Admitting: Radiation Oncology

## 2019-06-20 DIAGNOSIS — D0511 Intraductal carcinoma in situ of right breast: Secondary | ICD-10-CM | POA: Diagnosis not present

## 2019-06-21 ENCOUNTER — Ambulatory Visit
Admission: RE | Admit: 2019-06-21 | Discharge: 2019-06-21 | Disposition: A | Discharge status: Home / Self Care | Payer: BC Managed Care – PPO | Source: Ambulatory Visit | Attending: Radiation Oncology | Admitting: Radiation Oncology

## 2019-06-21 ENCOUNTER — Other Ambulatory Visit: Payer: Self-pay

## 2019-06-21 DIAGNOSIS — D0511 Intraductal carcinoma in situ of right breast: Secondary | ICD-10-CM | POA: Diagnosis not present

## 2019-06-22 ENCOUNTER — Other Ambulatory Visit: Payer: Self-pay

## 2019-06-22 ENCOUNTER — Ambulatory Visit
Admission: RE | Admit: 2019-06-22 | Discharge: 2019-06-22 | Disposition: A | Discharge status: Home / Self Care | Payer: BC Managed Care – PPO | Source: Ambulatory Visit | Attending: Radiation Oncology | Admitting: Radiation Oncology

## 2019-06-22 DIAGNOSIS — D0511 Intraductal carcinoma in situ of right breast: Secondary | ICD-10-CM | POA: Diagnosis not present

## 2019-06-23 ENCOUNTER — Ambulatory Visit
Admission: RE | Admit: 2019-06-23 | Discharge: 2019-06-23 | Disposition: A | Discharge status: Home / Self Care | Payer: BC Managed Care – PPO | Source: Ambulatory Visit | Attending: Radiation Oncology | Admitting: Radiation Oncology

## 2019-06-23 ENCOUNTER — Other Ambulatory Visit: Payer: Self-pay

## 2019-06-23 DIAGNOSIS — D0511 Intraductal carcinoma in situ of right breast: Secondary | ICD-10-CM | POA: Diagnosis not present

## 2019-06-26 ENCOUNTER — Other Ambulatory Visit: Payer: Self-pay

## 2019-06-26 ENCOUNTER — Ambulatory Visit
Admission: RE | Admit: 2019-06-26 | Discharge: 2019-06-26 | Disposition: A | Discharge status: Home / Self Care | Payer: BC Managed Care – PPO | Source: Ambulatory Visit | Attending: Radiation Oncology | Admitting: Radiation Oncology

## 2019-06-26 DIAGNOSIS — D0511 Intraductal carcinoma in situ of right breast: Secondary | ICD-10-CM | POA: Diagnosis not present

## 2019-06-27 ENCOUNTER — Other Ambulatory Visit: Payer: Self-pay

## 2019-06-27 ENCOUNTER — Ambulatory Visit
Admission: RE | Admit: 2019-06-27 | Discharge: 2019-06-27 | Disposition: A | Discharge status: Home / Self Care | Payer: BC Managed Care – PPO | Source: Ambulatory Visit | Attending: Radiation Oncology | Admitting: Radiation Oncology

## 2019-06-27 DIAGNOSIS — D0511 Intraductal carcinoma in situ of right breast: Secondary | ICD-10-CM | POA: Diagnosis not present

## 2019-06-28 ENCOUNTER — Ambulatory Visit
Admission: RE | Admit: 2019-06-28 | Discharge: 2019-06-28 | Disposition: A | Discharge status: Home / Self Care | Payer: BC Managed Care – PPO | Source: Ambulatory Visit | Attending: Radiation Oncology | Admitting: Radiation Oncology

## 2019-06-28 ENCOUNTER — Other Ambulatory Visit: Payer: Self-pay

## 2019-06-28 DIAGNOSIS — D0511 Intraductal carcinoma in situ of right breast: Secondary | ICD-10-CM | POA: Diagnosis not present

## 2019-06-29 ENCOUNTER — Other Ambulatory Visit: Payer: Self-pay

## 2019-06-29 ENCOUNTER — Ambulatory Visit
Admission: RE | Admit: 2019-06-29 | Discharge: 2019-06-29 | Disposition: A | Discharge status: Home / Self Care | Payer: BC Managed Care – PPO | Source: Ambulatory Visit | Attending: Radiation Oncology | Admitting: Radiation Oncology

## 2019-06-29 DIAGNOSIS — D0511 Intraductal carcinoma in situ of right breast: Secondary | ICD-10-CM | POA: Diagnosis not present

## 2019-06-30 ENCOUNTER — Ambulatory Visit
Admission: RE | Admit: 2019-06-30 | Discharge: 2019-06-30 | Disposition: A | Discharge status: Home / Self Care | Payer: BC Managed Care – PPO | Source: Ambulatory Visit | Attending: Radiation Oncology | Admitting: Radiation Oncology

## 2019-06-30 ENCOUNTER — Other Ambulatory Visit: Payer: Self-pay

## 2019-06-30 DIAGNOSIS — D0511 Intraductal carcinoma in situ of right breast: Secondary | ICD-10-CM | POA: Diagnosis not present

## 2019-07-03 ENCOUNTER — Other Ambulatory Visit: Payer: Self-pay

## 2019-07-03 ENCOUNTER — Ambulatory Visit
Admission: RE | Admit: 2019-07-03 | Discharge: 2019-07-03 | Disposition: A | Discharge status: Home / Self Care | Payer: BC Managed Care – PPO | Source: Ambulatory Visit | Attending: Radiation Oncology | Admitting: Radiation Oncology

## 2019-07-03 DIAGNOSIS — D0511 Intraductal carcinoma in situ of right breast: Secondary | ICD-10-CM | POA: Diagnosis not present

## 2019-07-04 ENCOUNTER — Other Ambulatory Visit: Payer: Self-pay

## 2019-07-04 ENCOUNTER — Ambulatory Visit
Admission: RE | Admit: 2019-07-04 | Discharge: 2019-07-04 | Disposition: A | Discharge status: Home / Self Care | Payer: Medicare Other | Source: Ambulatory Visit | Attending: Radiation Oncology | Admitting: Radiation Oncology

## 2019-07-04 DIAGNOSIS — D0511 Intraductal carcinoma in situ of right breast: Secondary | ICD-10-CM | POA: Insufficient documentation

## 2019-07-04 DIAGNOSIS — Z510 Encounter for antineoplastic radiation therapy: Secondary | ICD-10-CM | POA: Insufficient documentation

## 2019-07-04 DIAGNOSIS — Z170 Estrogen receptor positive status [ER+]: Secondary | ICD-10-CM | POA: Diagnosis not present

## 2019-07-05 ENCOUNTER — Ambulatory Visit
Admission: RE | Admit: 2019-07-05 | Discharge: 2019-07-05 | Disposition: A | Discharge status: Home / Self Care | Payer: Medicare Other | Source: Ambulatory Visit | Attending: Radiation Oncology | Admitting: Radiation Oncology

## 2019-07-05 ENCOUNTER — Inpatient Hospital Stay: Payer: Medicare Other | Attending: Oncology

## 2019-07-05 ENCOUNTER — Other Ambulatory Visit: Payer: Self-pay

## 2019-07-05 DIAGNOSIS — D0511 Intraductal carcinoma in situ of right breast: Secondary | ICD-10-CM | POA: Insufficient documentation

## 2019-07-05 LAB — CBC
HCT: 41.1 % (ref 36.0–46.0)
Hemoglobin: 13.4 g/dL (ref 12.0–15.0)
MCH: 30.2 pg (ref 26.0–34.0)
MCHC: 32.6 g/dL (ref 30.0–36.0)
MCV: 92.6 fL (ref 80.0–100.0)
Platelets: 259 10*3/uL (ref 150–400)
RBC: 4.44 MIL/uL (ref 3.87–5.11)
RDW: 12.7 % (ref 11.5–15.5)
WBC: 5.8 10*3/uL (ref 4.0–10.5)
nRBC: 0 % (ref 0.0–0.2)

## 2019-07-06 ENCOUNTER — Ambulatory Visit
Admission: RE | Admit: 2019-07-06 | Discharge: 2019-07-06 | Disposition: A | Discharge status: Home / Self Care | Payer: Medicare Other | Source: Ambulatory Visit | Attending: Radiation Oncology | Admitting: Radiation Oncology

## 2019-07-06 ENCOUNTER — Other Ambulatory Visit: Payer: Self-pay

## 2019-07-06 ENCOUNTER — Telehealth: Payer: Self-pay | Admitting: *Deleted

## 2019-07-06 DIAGNOSIS — D0511 Intraductal carcinoma in situ of right breast: Secondary | ICD-10-CM | POA: Diagnosis not present

## 2019-07-06 NOTE — Telephone Encounter (Signed)
Please schedule her to see me 2 weeks after finishing Radiation. Lab -cbc cmp, MD 15 minutes.   also schedule her to have bone density done just prior to her follow up with me. Thanks.

## 2019-07-06 NOTE — Telephone Encounter (Signed)
Patient called stating Dr Tasia Catchings wanted to know when she will complete her radiation therapy. She states she completes it on 08/09/19 and she thinks she is supposed to be scheduled for lab and BMD and follow up with Dr Tasia Catchings. Please call patient with appts

## 2019-07-06 NOTE — Telephone Encounter (Signed)
Message sent to scheduling 

## 2019-07-07 ENCOUNTER — Ambulatory Visit
Admission: RE | Admit: 2019-07-07 | Discharge: 2019-07-07 | Disposition: A | Discharge status: Home / Self Care | Payer: Medicare Other | Source: Ambulatory Visit | Attending: Radiation Oncology | Admitting: Radiation Oncology

## 2019-07-07 ENCOUNTER — Other Ambulatory Visit: Payer: Self-pay

## 2019-07-07 DIAGNOSIS — D0511 Intraductal carcinoma in situ of right breast: Secondary | ICD-10-CM | POA: Diagnosis not present

## 2019-07-11 ENCOUNTER — Other Ambulatory Visit: Payer: Self-pay

## 2019-07-11 ENCOUNTER — Ambulatory Visit
Admission: RE | Admit: 2019-07-11 | Discharge: 2019-07-11 | Disposition: A | Discharge status: Home / Self Care | Payer: Medicare Other | Source: Ambulatory Visit | Attending: Radiation Oncology | Admitting: Radiation Oncology

## 2019-07-11 DIAGNOSIS — D0511 Intraductal carcinoma in situ of right breast: Secondary | ICD-10-CM | POA: Diagnosis not present

## 2019-07-12 ENCOUNTER — Other Ambulatory Visit: Payer: Self-pay

## 2019-07-12 ENCOUNTER — Ambulatory Visit
Admission: RE | Admit: 2019-07-12 | Discharge: 2019-07-12 | Disposition: A | Discharge status: Home / Self Care | Payer: Medicare Other | Source: Ambulatory Visit | Attending: Radiation Oncology | Admitting: Radiation Oncology

## 2019-07-12 DIAGNOSIS — D0511 Intraductal carcinoma in situ of right breast: Secondary | ICD-10-CM | POA: Diagnosis not present

## 2019-07-13 ENCOUNTER — Encounter: Payer: Self-pay | Admitting: Oncology

## 2019-07-13 ENCOUNTER — Ambulatory Visit
Admission: RE | Admit: 2019-07-13 | Discharge: 2019-07-13 | Disposition: A | Discharge status: Home / Self Care | Payer: Medicare Other | Source: Ambulatory Visit | Attending: Radiation Oncology | Admitting: Radiation Oncology

## 2019-07-13 ENCOUNTER — Other Ambulatory Visit: Payer: Self-pay

## 2019-07-13 DIAGNOSIS — D0511 Intraductal carcinoma in situ of right breast: Secondary | ICD-10-CM | POA: Diagnosis not present

## 2019-07-14 ENCOUNTER — Ambulatory Visit
Admission: RE | Admit: 2019-07-14 | Discharge: 2019-07-14 | Disposition: A | Discharge status: Home / Self Care | Payer: Medicare Other | Source: Ambulatory Visit | Attending: Radiation Oncology | Admitting: Radiation Oncology

## 2019-07-14 ENCOUNTER — Other Ambulatory Visit: Payer: Self-pay

## 2019-07-14 DIAGNOSIS — D0511 Intraductal carcinoma in situ of right breast: Secondary | ICD-10-CM | POA: Diagnosis not present

## 2019-07-17 ENCOUNTER — Other Ambulatory Visit: Payer: Self-pay

## 2019-07-17 ENCOUNTER — Ambulatory Visit
Admission: RE | Admit: 2019-07-17 | Discharge: 2019-07-17 | Disposition: A | Discharge status: Home / Self Care | Payer: Medicare Other | Source: Ambulatory Visit | Attending: Radiation Oncology | Admitting: Radiation Oncology

## 2019-07-17 DIAGNOSIS — D0511 Intraductal carcinoma in situ of right breast: Secondary | ICD-10-CM | POA: Diagnosis not present

## 2019-07-18 ENCOUNTER — Other Ambulatory Visit: Payer: Self-pay

## 2019-07-18 ENCOUNTER — Ambulatory Visit
Admission: RE | Admit: 2019-07-18 | Discharge: 2019-07-18 | Disposition: A | Discharge status: Home / Self Care | Payer: Medicare Other | Source: Ambulatory Visit | Attending: Radiation Oncology | Admitting: Radiation Oncology

## 2019-07-18 DIAGNOSIS — D0511 Intraductal carcinoma in situ of right breast: Secondary | ICD-10-CM | POA: Diagnosis not present

## 2019-07-19 ENCOUNTER — Other Ambulatory Visit: Payer: Self-pay

## 2019-07-19 ENCOUNTER — Ambulatory Visit
Admission: RE | Admit: 2019-07-19 | Discharge: 2019-07-19 | Disposition: A | Discharge status: Home / Self Care | Payer: Medicare Other | Source: Ambulatory Visit | Attending: Radiation Oncology | Admitting: Radiation Oncology

## 2019-07-19 ENCOUNTER — Inpatient Hospital Stay: Payer: Medicare Other

## 2019-07-19 DIAGNOSIS — D0511 Intraductal carcinoma in situ of right breast: Secondary | ICD-10-CM | POA: Diagnosis not present

## 2019-07-19 LAB — CBC
HCT: 38.8 % (ref 36.0–46.0)
Hemoglobin: 12.8 g/dL (ref 12.0–15.0)
MCH: 30.9 pg (ref 26.0–34.0)
MCHC: 33 g/dL (ref 30.0–36.0)
MCV: 93.7 fL (ref 80.0–100.0)
Platelets: 245 10*3/uL (ref 150–400)
RBC: 4.14 MIL/uL (ref 3.87–5.11)
RDW: 12.8 % (ref 11.5–15.5)
WBC: 5.7 10*3/uL (ref 4.0–10.5)
nRBC: 0 % (ref 0.0–0.2)

## 2019-07-20 ENCOUNTER — Ambulatory Visit
Admission: RE | Admit: 2019-07-20 | Discharge: 2019-07-20 | Disposition: A | Discharge status: Home / Self Care | Payer: Medicare Other | Source: Ambulatory Visit | Attending: Radiation Oncology | Admitting: Radiation Oncology

## 2019-07-20 ENCOUNTER — Other Ambulatory Visit: Payer: Self-pay

## 2019-07-20 DIAGNOSIS — D0511 Intraductal carcinoma in situ of right breast: Secondary | ICD-10-CM | POA: Diagnosis not present

## 2019-07-21 ENCOUNTER — Ambulatory Visit
Admission: RE | Admit: 2019-07-21 | Discharge: 2019-07-21 | Disposition: A | Discharge status: Home / Self Care | Payer: Medicare Other | Source: Ambulatory Visit | Attending: Radiation Oncology | Admitting: Radiation Oncology

## 2019-07-21 ENCOUNTER — Other Ambulatory Visit: Payer: Self-pay

## 2019-07-21 DIAGNOSIS — D0511 Intraductal carcinoma in situ of right breast: Secondary | ICD-10-CM | POA: Diagnosis not present

## 2019-07-24 ENCOUNTER — Other Ambulatory Visit: Payer: Self-pay

## 2019-07-24 ENCOUNTER — Ambulatory Visit
Admission: RE | Admit: 2019-07-24 | Discharge: 2019-07-24 | Disposition: A | Discharge status: Home / Self Care | Payer: Medicare Other | Source: Ambulatory Visit | Attending: Radiation Oncology | Admitting: Radiation Oncology

## 2019-07-24 DIAGNOSIS — D0511 Intraductal carcinoma in situ of right breast: Secondary | ICD-10-CM | POA: Diagnosis not present

## 2019-07-25 ENCOUNTER — Ambulatory Visit
Admission: RE | Admit: 2019-07-25 | Discharge: 2019-07-25 | Disposition: A | Discharge status: Home / Self Care | Payer: Medicare Other | Source: Ambulatory Visit | Attending: Radiation Oncology | Admitting: Radiation Oncology

## 2019-07-25 ENCOUNTER — Other Ambulatory Visit: Payer: Self-pay

## 2019-07-25 DIAGNOSIS — D0511 Intraductal carcinoma in situ of right breast: Secondary | ICD-10-CM | POA: Diagnosis not present

## 2019-07-26 ENCOUNTER — Ambulatory Visit
Admission: RE | Admit: 2019-07-26 | Discharge: 2019-07-26 | Disposition: A | Discharge status: Home / Self Care | Payer: Medicare Other | Source: Ambulatory Visit | Attending: Radiation Oncology | Admitting: Radiation Oncology

## 2019-07-26 ENCOUNTER — Other Ambulatory Visit: Payer: Medicare Other

## 2019-07-26 ENCOUNTER — Other Ambulatory Visit: Payer: Self-pay

## 2019-07-26 DIAGNOSIS — D0511 Intraductal carcinoma in situ of right breast: Secondary | ICD-10-CM | POA: Diagnosis not present

## 2019-07-27 ENCOUNTER — Ambulatory Visit
Admission: RE | Admit: 2019-07-27 | Discharge: 2019-07-27 | Disposition: A | Discharge status: Home / Self Care | Payer: Medicare Other | Source: Ambulatory Visit | Attending: Radiation Oncology | Admitting: Radiation Oncology

## 2019-07-27 ENCOUNTER — Other Ambulatory Visit: Payer: Self-pay

## 2019-07-27 DIAGNOSIS — D0511 Intraductal carcinoma in situ of right breast: Secondary | ICD-10-CM | POA: Diagnosis not present

## 2019-07-28 ENCOUNTER — Other Ambulatory Visit: Payer: Self-pay

## 2019-07-28 ENCOUNTER — Ambulatory Visit
Admission: RE | Admit: 2019-07-28 | Discharge: 2019-07-28 | Disposition: A | Discharge status: Home / Self Care | Payer: Medicare Other | Source: Ambulatory Visit | Attending: Radiation Oncology | Admitting: Radiation Oncology

## 2019-07-28 DIAGNOSIS — D0511 Intraductal carcinoma in situ of right breast: Secondary | ICD-10-CM | POA: Diagnosis not present

## 2019-07-31 ENCOUNTER — Other Ambulatory Visit: Payer: Self-pay

## 2019-07-31 ENCOUNTER — Ambulatory Visit
Admission: RE | Admit: 2019-07-31 | Discharge: 2019-07-31 | Disposition: A | Discharge status: Home / Self Care | Payer: Medicare Other | Source: Ambulatory Visit | Attending: Radiation Oncology | Admitting: Radiation Oncology

## 2019-07-31 ENCOUNTER — Other Ambulatory Visit: Payer: Self-pay | Admitting: *Deleted

## 2019-07-31 DIAGNOSIS — D0511 Intraductal carcinoma in situ of right breast: Secondary | ICD-10-CM | POA: Diagnosis not present

## 2019-07-31 MED ORDER — SILVER SULFADIAZINE 1 % EX CREA: 1.0000 "application " | TOPICAL_CREAM | Freq: Two times a day (BID) | CUTANEOUS | 2 refills | Status: DC

## 2019-08-01 ENCOUNTER — Other Ambulatory Visit: Payer: Self-pay

## 2019-08-01 ENCOUNTER — Ambulatory Visit
Admission: RE | Admit: 2019-08-01 | Discharge: 2019-08-01 | Disposition: A | Discharge status: Home / Self Care | Payer: Medicare Other | Source: Ambulatory Visit | Attending: Radiation Oncology | Admitting: Radiation Oncology

## 2019-08-01 DIAGNOSIS — D0511 Intraductal carcinoma in situ of right breast: Secondary | ICD-10-CM | POA: Diagnosis not present

## 2019-08-02 ENCOUNTER — Inpatient Hospital Stay: Payer: Medicare Other

## 2019-08-02 ENCOUNTER — Ambulatory Visit
Admission: RE | Admit: 2019-08-02 | Discharge: 2019-08-02 | Disposition: A | Discharge status: Home / Self Care | Payer: Medicare Other | Source: Ambulatory Visit | Attending: Radiation Oncology | Admitting: Radiation Oncology

## 2019-08-02 ENCOUNTER — Other Ambulatory Visit: Payer: Self-pay

## 2019-08-02 DIAGNOSIS — D0511 Intraductal carcinoma in situ of right breast: Secondary | ICD-10-CM

## 2019-08-02 LAB — CBC
HCT: 38.8 % (ref 36.0–46.0)
Hemoglobin: 12.8 g/dL (ref 12.0–15.0)
MCH: 30.9 pg (ref 26.0–34.0)
MCHC: 33 g/dL (ref 30.0–36.0)
MCV: 93.7 fL (ref 80.0–100.0)
Platelets: 235 10*3/uL (ref 150–400)
RBC: 4.14 MIL/uL (ref 3.87–5.11)
RDW: 12.6 % (ref 11.5–15.5)
WBC: 5.4 10*3/uL (ref 4.0–10.5)
nRBC: 0 % (ref 0.0–0.2)

## 2019-08-03 ENCOUNTER — Ambulatory Visit
Admission: RE | Admit: 2019-08-03 | Discharge: 2019-08-03 | Disposition: A | Discharge status: Home / Self Care | Payer: Medicare Other | Source: Ambulatory Visit | Attending: Radiation Oncology | Admitting: Radiation Oncology

## 2019-08-03 ENCOUNTER — Other Ambulatory Visit: Payer: Self-pay

## 2019-08-03 DIAGNOSIS — Z510 Encounter for antineoplastic radiation therapy: Secondary | ICD-10-CM | POA: Diagnosis not present

## 2019-08-03 DIAGNOSIS — D0511 Intraductal carcinoma in situ of right breast: Secondary | ICD-10-CM | POA: Diagnosis present

## 2019-08-03 DIAGNOSIS — Z170 Estrogen receptor positive status [ER+]: Secondary | ICD-10-CM | POA: Diagnosis not present

## 2019-08-04 ENCOUNTER — Ambulatory Visit
Admission: RE | Admit: 2019-08-04 | Discharge: 2019-08-04 | Disposition: A | Discharge status: Home / Self Care | Payer: Medicare Other | Source: Ambulatory Visit | Attending: Radiation Oncology | Admitting: Radiation Oncology

## 2019-08-04 ENCOUNTER — Other Ambulatory Visit: Payer: Self-pay

## 2019-08-04 DIAGNOSIS — D0511 Intraductal carcinoma in situ of right breast: Secondary | ICD-10-CM | POA: Diagnosis not present

## 2019-08-07 ENCOUNTER — Ambulatory Visit
Admission: RE | Admit: 2019-08-07 | Discharge: 2019-08-07 | Disposition: A | Discharge status: Home / Self Care | Payer: Medicare Other | Source: Ambulatory Visit | Attending: Radiation Oncology | Admitting: Radiation Oncology

## 2019-08-07 ENCOUNTER — Other Ambulatory Visit: Payer: Self-pay

## 2019-08-07 DIAGNOSIS — D0511 Intraductal carcinoma in situ of right breast: Secondary | ICD-10-CM | POA: Diagnosis not present

## 2019-08-08 ENCOUNTER — Other Ambulatory Visit: Payer: Self-pay

## 2019-08-08 ENCOUNTER — Ambulatory Visit
Admission: RE | Admit: 2019-08-08 | Discharge: 2019-08-08 | Disposition: A | Discharge status: Home / Self Care | Payer: Medicare Other | Source: Ambulatory Visit | Attending: Radiation Oncology | Admitting: Radiation Oncology

## 2019-08-08 DIAGNOSIS — D0511 Intraductal carcinoma in situ of right breast: Secondary | ICD-10-CM | POA: Diagnosis not present

## 2019-08-09 ENCOUNTER — Other Ambulatory Visit: Payer: Self-pay

## 2019-08-09 ENCOUNTER — Ambulatory Visit
Admission: RE | Admit: 2019-08-09 | Discharge: 2019-08-09 | Disposition: A | Discharge status: Home / Self Care | Payer: Medicare Other | Source: Ambulatory Visit | Attending: Radiation Oncology | Admitting: Radiation Oncology

## 2019-08-09 DIAGNOSIS — D0511 Intraductal carcinoma in situ of right breast: Secondary | ICD-10-CM | POA: Diagnosis not present

## 2019-08-21 ENCOUNTER — Ambulatory Visit
Admission: RE | Admit: 2019-08-21 | Discharge: 2019-08-21 | Disposition: A | Discharge status: Home / Self Care | Payer: Medicare Other | Source: Ambulatory Visit | Attending: Oncology | Admitting: Oncology

## 2019-08-21 DIAGNOSIS — Z13820 Encounter for screening for osteoporosis: Secondary | ICD-10-CM | POA: Insufficient documentation

## 2019-08-21 DIAGNOSIS — D0511 Intraductal carcinoma in situ of right breast: Secondary | ICD-10-CM | POA: Diagnosis not present

## 2019-08-21 DIAGNOSIS — Z780 Asymptomatic menopausal state: Secondary | ICD-10-CM | POA: Diagnosis not present

## 2019-08-21 DIAGNOSIS — M8580 Other specified disorders of bone density and structure, unspecified site: Secondary | ICD-10-CM | POA: Insufficient documentation

## 2019-08-23 ENCOUNTER — Other Ambulatory Visit: Payer: Self-pay

## 2019-08-23 ENCOUNTER — Encounter: Payer: Self-pay | Admitting: Oncology

## 2019-08-23 ENCOUNTER — Inpatient Hospital Stay (HOSPITAL_BASED_OUTPATIENT_CLINIC_OR_DEPARTMENT_OTHER): Payer: Medicare Other | Admitting: Oncology

## 2019-08-23 ENCOUNTER — Inpatient Hospital Stay: Payer: Medicare Other | Attending: Oncology

## 2019-08-23 VITALS — BP 119/83 | HR 81 | Temp 97.0°F | Resp 18 | Wt 222.7 lb

## 2019-08-23 DIAGNOSIS — Z803 Family history of malignant neoplasm of breast: Secondary | ICD-10-CM | POA: Diagnosis not present

## 2019-08-23 DIAGNOSIS — Z809 Family history of malignant neoplasm, unspecified: Secondary | ICD-10-CM

## 2019-08-23 DIAGNOSIS — Z170 Estrogen receptor positive status [ER+]: Secondary | ICD-10-CM | POA: Diagnosis not present

## 2019-08-23 DIAGNOSIS — D0511 Intraductal carcinoma in situ of right breast: Secondary | ICD-10-CM | POA: Diagnosis present

## 2019-08-23 DIAGNOSIS — M8580 Other specified disorders of bone density and structure, unspecified site: Secondary | ICD-10-CM | POA: Insufficient documentation

## 2019-08-23 DIAGNOSIS — Z800 Family history of malignant neoplasm of digestive organs: Secondary | ICD-10-CM | POA: Insufficient documentation

## 2019-08-23 HISTORY — DX: Other specified disorders of bone density and structure, unspecified site: M85.80

## 2019-08-23 LAB — COMPREHENSIVE METABOLIC PANEL
ALT: 23 U/L (ref 0–44)
AST: 22 U/L (ref 15–41)
Albumin: 4 g/dL (ref 3.5–5.0)
Alkaline Phosphatase: 85 U/L (ref 38–126)
Anion gap: 7 (ref 5–15)
BUN: 15 mg/dL (ref 8–23)
CO2: 27 mmol/L (ref 22–32)
Calcium: 9.3 mg/dL (ref 8.9–10.3)
Chloride: 108 mmol/L (ref 98–111)
Creatinine, Ser: 0.75 mg/dL (ref 0.44–1.00)
GFR calc Af Amer: >60 mL/min (ref >60)
GFR calc non Af Amer: >60 mL/min (ref >60)
Glucose, Bld: 108 mg/dL — ABNORMAL HIGH (ref 70–99)
Potassium: 4.3 mmol/L (ref 3.5–5.1)
Sodium: 142 mmol/L (ref 135–145)
Total Bilirubin: 0.8 mg/dL (ref 0.3–1.2)
Total Protein: 7 g/dL (ref 6.5–8.1)

## 2019-08-23 LAB — CBC WITH DIFFERENTIAL/PLATELET
Abs Immature Granulocytes: 0.01 10*3/uL (ref 0.00–0.07)
Basophils Absolute: 0 10*3/uL (ref 0.0–0.1)
Basophils Relative: 1 %
Eosinophils Absolute: 0.2 10*3/uL (ref 0.0–0.5)
Eosinophils Relative: 4 %
HCT: 39.4 % (ref 36.0–46.0)
Hemoglobin: 13.1 g/dL (ref 12.0–15.0)
Immature Granulocytes: 0 %
Lymphocytes Relative: 18 %
Lymphs Abs: 0.9 10*3/uL (ref 0.7–4.0)
MCH: 31 pg (ref 26.0–34.0)
MCHC: 33.2 g/dL (ref 30.0–36.0)
MCV: 93.1 fL (ref 80.0–100.0)
Monocytes Absolute: 0.5 10*3/uL (ref 0.1–1.0)
Monocytes Relative: 9 %
Neutro Abs: 3.3 10*3/uL (ref 1.7–7.7)
Neutrophils Relative %: 68 %
Platelets: 244 10*3/uL (ref 150–400)
RBC: 4.23 MIL/uL (ref 3.87–5.11)
RDW: 12.8 % (ref 11.5–15.5)
WBC: 4.8 10*3/uL (ref 4.0–10.5)
nRBC: 0 % (ref 0.0–0.2)

## 2019-08-23 MED ORDER — LETROZOLE 2.5 MG PO TABS: 2.5000 mg | ORAL_TABLET | Freq: Every day | ORAL | 0 refills | Status: DC
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
## 2019-08-23 NOTE — Progress Notes (Signed)
Hematology/Oncology follow up  note [facility] Telephone:([phone]) [phone] [phone]) [phone]   Patient Care Team: Hortencia Pilar, MD as PCP - General (Family Medicine)  REFERRING PROVIDER: Hortencia Pilar, MD  CHIEF COMPLAINTS/REASON FOR VISIT:  Follow up of DCIS  HISTORY OF PRESENTING ILLNESS:  Jane Gardner is a  63 y.o.  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  Estrogen receptor status, >90%  Nipple discharge: Denies Family history: Mother had history of breast cancer and bladder cancer.  Alive.  Maternal aunt has pancreatic cancer.  Maternal uncle has stomach cancer.  Maternal uncle has lung cancer. OCP use: Previous use of OCP Estrogen and progesterone therapy: Menopause in 27s, she was on hormone replacement for a few years. History of radiation to chest: denies.  No previous breast biopsy.  # 05/15/2019 patient underwent right lumpectomy with sentinel lymph node biopsy. 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  Regional lymph nodes uninvolved by tumor cells. pTis N0 05/24/2019 reexcision negative for DCIS. 08/09/2019 finished adjuvant radiation INTERVAL HISTORY Jane Gardner is a 63 y.o. female who has above history reviewed by me today presents for follow up visit for management of DCIS Problems and complaints are listed below: Patient feels adjuvant radiation 2 weeks ago. Reports some radiation dermatitis, soreness around the area.    otherwise  no new complaints.    She has also had a bone density done which showed osteopenia.   Denies fever, chills, nausea, vomiting, diarrhea, chest pain, shortness of breath, abdominal pain, urinary symptoms, lower extremity swelling.    Review of Systems  Constitutional: Negative for appetite change, chills, fatigue and fever.  HENT:   Negative for hearing loss and voice change.   Eyes: Negative for eye problems.  Respiratory: Negative for chest tightness and cough.   Cardiovascular: Negative for chest pain.  Gastrointestinal: Negative for abdominal distention, abdominal pain and blood in stool.  Endocrine: Negative for hot flashes.  Genitourinary: Negative for difficulty urinating and frequency.   Musculoskeletal: Negative for arthralgias.  Skin: Negative for itching and rash.  Neurological: Negative for extremity weakness.  Hematological: Negative for adenopathy.  Psychiatric/Behavioral: Negative for confusion.    MEDICAL HISTORY:  Past Medical History:  Diagnosis Date  . Asthma   . Fibrocystic breast   . GERD (gastroesophageal reflux disease)   . Hyperlipemia   . Hypertension   . Pneumonia     SURGICAL HISTORY: Past Surgical History:  Procedure Laterality Date  . BREAST BIOPSY Right 05/02/2019   right breast stereo bx of calcs, coil clip, path pending  . BREAST CYST ASPIRATION     ? side neg  . MASTECTOMY, PARTIAL Right 05/24/2019   Procedure: MASTECTOMY PARTIAL, REEXCISION OF RIGHT BREAST;  Surgeon: Herbert Pun, MD;  Location: [facility] ORS;  Service: General;  Laterality: Right;  . PARTIAL MASTECTOMY WITH NEEDLE LOCALIZATION Right 05/15/2019   Procedure: PARTIAL MASTECTOMY WITH NEEDLE LOCALIZATION AND SENTINEL NODE, RIGHT;  Surgeon: Herbert Pun, MD;  Location: [facility] ORS;  Service: General;  Laterality: Right;  . TONSILLECTOMY    . TONSILLECTOMY AND ADENOIDECTOMY    .  TUBAL LIGATION      SOCIAL HISTORY: Social History   Socioeconomic History  . Marital  status: Married    Spouse name: Not on file  . Number of children: Not on file  . Years of education: Not on file  . Highest education level: Not on file  Occupational History  . Not on file  Social Needs  . Financial resource strain: Not on file  . Food insecurity    Worry: Not on file    Inability: Not on file  . Transportation needs    Medical: Not on file    Non-medical: Not on file  Tobacco Use  . Smoking status: Former Smoker    Packs/day: 1.00    Years: 3.00    Pack years: 3.00    Types: Cigarettes    Quit date: 1977    Years since quitting: 43.8  . Smokeless tobacco: Never Used  Substance and Sexual Activity  . Alcohol use: Yes    Comment: rare wine  . Drug use: No  . Sexual activity: Not on file  Lifestyle  . Physical activity    Days per week: Not on file    Minutes per session: Not on file  . Stress: Not on file  Relationships  . Social Herbalist on phone: Not on file    Gets together: Not on file    Attends religious service: Not on file    Active member of club or organization: Not on file    Attends meetings of clubs or organizations: Not on file    Relationship status: Not on file  . Intimate partner violence    Fear of current or ex partner: Not on file    Emotionally abused: Not on file    Physically abused: Not on file    Forced sexual activity: Not on file  Other Topics Concern  . Not on file  Social History Narrative  . Not on file    FAMILY HISTORY: Family History  Problem Relation Age of Onset  . Hypertension Mother   . Breast cancer Mother 40  . Hyperlipidemia Mother   . Bladder Cancer Mother   . Diabetes Father   . Pancreatic cancer Maternal Aunt   . Stomach cancer Maternal Uncle   . Diabetes Paternal Grandmother   . Lung cancer Maternal Uncle     ALLERGIES:  is allergic to hyoscyamine sulfate; latex; and other.  MEDICATIONS:  Current Outpatient Medications  Medication Sig Dispense Refill  . albuterol  (PROVENTIL HFA;VENTOLIN HFA) 108 (90 Base) MCG/ACT inhaler Inhale 1-2 puffs into the lungs every 6 (six) hours as needed for wheezing or shortness of breath. 1 Inhaler 0  . Ascorbic Acid (VITAMIN C) 1000 MG tablet Take 1 tablet by mouth daily.    Marland Kitchen atorvastatin (LIPITOR) 40 MG tablet Take 40 mg by mouth daily.    . Biotin 5000 MCG CAPS Take by mouth.    . cetirizine (ZYRTEC) 10 MG tablet Take 10 mg by mouth daily.    . Cholecalciferol (VITAMIN D3) 25 MCG (1000 UT) CAPS Take by mouth.    . co-enzyme Q-10 50 MG capsule Take 100 mg by mouth daily.    Marland Kitchen ELDERBERRY PO Take by mouth.    . famotidine (PEPCID) 20 MG tablet Take by mouth.    . fluticasone (FLONASE) 50 MCG/ACT nasal spray Place 2 sprays into both nostrils daily. 16 g 2  . lisinopril (ZESTRIL) 10 MG tablet Take 10 mg  by mouth daily.    . Misc Natural Products (GLUCOSAMINE CHONDROITIN ADV PO) Take by mouth.    . letrozole (FEMARA) 2.5 MG tablet Take 1 tablet (2.5 mg total) by mouth daily. 90 tablet 0   No current facility-administered medications for this visit.      PHYSICAL EXAMINATION: ECOG PERFORMANCE STATUS: 0 - Asymptomatic Vitals:   08/23/19 1334  BP: 119/83  Pulse: 81  Resp: 18  Temp: (!) 97 F (36.1 C)   Filed Weights   08/23/19 1334  Weight: 222 lb 11.2 oz (101 kg)    Physical Exam Constitutional:      General: She is not in acute distress. HENT:     Head: Normocephalic and atraumatic.  Eyes:     General: No scleral icterus.    Pupils: Pupils are equal, round, and reactive to light.  Neck:     Musculoskeletal: Normal range of motion and neck supple.  Cardiovascular:     Rate and Rhythm: Normal rate and regular rhythm.     Heart sounds: Normal heart sounds.  Pulmonary:     Effort: Pulmonary effort is normal. No respiratory distress.     Breath sounds: No wheezing.  Abdominal:     General: Bowel sounds are normal. There is no distension.     Palpations: Abdomen is soft. There is no mass.      Tenderness: There is no abdominal tenderness.  Musculoskeletal: Normal range of motion.        General: No deformity.  Skin:    General: Skin is warm and dry.     Findings: No erythema or rash.  Neurological:     Mental Status: She is alert and oriented to person, place, and time.     Cranial Nerves: No cranial nerve deficit.     Coordination: Coordination normal.  Psychiatric:        Behavior: Behavior normal.        Thought Content: Thought content normal.   Right breast status post lumpectomy and sentinel lymph node biopsy.  No erythema or discharge of surgical site.  Healing well.   LABORATORY DATA:  I have reviewed the data as listed Lab Results  Component Value Date   WBC 4.8 08/23/2019   HGB 13.1 08/23/2019   HCT 39.4 08/23/2019   MCV 93.1 08/23/2019   PLT 244 08/23/2019   Recent Labs    08/23/19 1310  NA 142  K 4.3  CL 108  CO2 27  GLUCOSE 108*  BUN 15  CREATININE 0.75  CALCIUM 9.3  GFRNONAA >60  GFRAA >60  PROT 7.0  ALBUMIN 4.0  AST 22  ALT 23  ALKPHOS 85  BILITOT 0.8   Iron/TIBC/Ferritin/ %Sat No results found for: IRON, TIBC, FERRITIN, IRONPCTSAT   Patient had labs done at [facility] clinic on 05/09/2019.  Labs are reviewed.  RADIOGRAPHIC STUDIES: I have personally reviewed the radiological images as listed and agreed with the findings in the report. Dg Bone Density  Result Date: 08/21/2019 EXAM: DUAL X-RAY ABSORPTIOMETRY (DXA) FOR BONE MINERAL DENSITY IMPRESSION: Technologist: [place] Old Your patient Sharronda Jakubczak completed a BMD test on 08/21/2019 using the [place] (analysis version: 14.10) manufactured by [employer]. The following summarizes the results of our evaluation. PATIENT BIOGRAPHICAL: Name: Lashona, Hattabaugh Patient ID: [id number] Birth Date: 1956-08-06 Height: 63.0 in. Gender: Female Exam Date: 08/21/2019 Weight: 224.8 lbs. Indications: Caucasian, History of Breast Cancer, History of Fracture (Adult), History of Radiation,  Postmenopausal Fractures: Left foot Treatments: Vitamin  D ASSESSMENT: The BMD measured at Femur Neck Right is 0.717 g/cm2 with a T-score of -2.3. This patient is considered osteopenic according to [place] [facility]) criteria. L-3 & 4 was excluded due to degenerative changes. The scan quality is good. Site Region Measured Measured WHO Young Adult BMD Date       Age      Classification T-score AP Spine L1-L2 08/21/2019 65.5 Osteopenia -1.3 1.014 g/cm2 DualFemur Neck Right 08/21/2019 65.5 Osteopenia -2.3 0.717 g/cm2 World Health Organization [facility]) criteria for post-menopausal, Caucasian Women: Normal:       T-score at or above -1 SD Osteopenia:   T-score between -1 and -2.5 SD Osteoporosis: T-score at or below -2.5 SD RECOMMENDATIONS: 1. All patients should optimize calcium and vitamin D intake. 2. Consider FDA-approved medical therapies in postmenopausal women and men aged 43 years and older, based on the following: a. A hip or vertebral(clinical or morphometric) fracture b. T-score < -2.5 at the femoral neck or spine after appropriate evaluation to exclude secondary causes c. Low bone mass (T-score between -1.0 and -2.5 at the femoral neck or spine) and a 10-year probability of a hip fracture > 3% or a 10-year probability of a major osteoporosis-related fracture > 20% based on the [place]-adapted WHO algorithm d. Clinician judgment and/or patient preferences may indicate treatment for people with 10-year fracture probabilities above or below these levels FOLLOW-UP: People with diagnosed cases of osteoporosis or at high risk for fracture should have regular bone mineral density tests. For patients eligible for Medicare, routine testing is allowed once every 2 years. The testing frequency can be increased to one year for patients who have rapidly progressing disease, those who are receiving or discontinuing medical therapy to restore bone mass, or have additional risk factors. I have reviewed this report, and  agree with the above findings. [facility] Radiology Dear Dr. Tasia Catchings, Your patient Luttie Sparhawk completed a FRAX assessment on 08/21/2019 using the [place] (analysis version: 14.10) manufactured by [employer]. The following summarizes the results of our evaluation. PATIENT BIOGRAPHICAL: Name: Sherrika, Riley Patient ID: [id number] Birth Date: August 28, 1956 Height:    63.0 in. Gender:     Female    [age]       Weight:    224.8 lbs. Ethnicity:  White                            Exam Date: 08/21/2019 FRAX* RESULTS:  (version: 3.5) 10-year Probability of Fracture1 Major Osteoporotic Fracture2 Hip Fracture 17.8% 3.2% Population: [place] (Caucasian) Risk Factors: History of Fracture (Adult) Based on Femur (Right) Neck BMD 1 -The 10-year probability of fracture may be lower than reported if the patient has received treatment. 2 -Major Osteoporotic Fracture: Clinical Spine, Forearm, Hip or Shoulder *FRAX is a Materials engineer of the [employer] for Metabolic Bone Disease, a [place] (WHO) [facility]. ASSESSMENT: The probability of a major osteoporotic fracture is 17.8% within the next ten years. The probability of a hip fracture is 3.2% within the next ten years. . Electronically Signed   By: Lowella Grip III M.D.   On: 08/21/2019 13:36    ASSESSMENT & PLAN:  1. Ductal carcinoma in situ (DCIS) of right breast   2. Family history of cancer   3. Osteopenia, unspecified location   High-grade DCIS, ER positive, status post lumpectomy, and adjuvant radiation. Discussed  with patient about adjuvant antiestrogen treatment with aromatase inhibitor. Rationale of using aromatase inhibitor -letrozole discussed with patient.  Side effects of Letrozole including but not limited to hot flush, joint pain, fatigue, mood swing, osteoporosis discussed with patient. Patient voices understanding and willing to proceed.  Prescription for letrozole 2.5 mg  daily was sent to pharmacy.  #Osteopenia, bone density results were reviewed and discussed with patient. Recommend patient to take calcium 1000 mg and vitamin D 1000 units daily. Exercise as tolerated. I discussed about rationale and side effects of bisphosphonate.  Zometa 4mg  Q12months Discussed at length the risk factors [race, post-menopausal status, use of AI]; prevention/treatment strategies- Discussed the potential side effects of bisphosphonate including but not limited to- hypocalcemia and ONJ; discussed not to have invasive dental procedures few weeks around the time of the infusions Recommend patient to obtain dental clearance  #Patient has family history of breast cancer, pancreatic cancer, stomach cancer. Recommend patient to consider about genetic testing.  She will update me if she wants to proceed with genetic testing.   All questions were answered. The patient knows to call the clinic with any problems questions or concerns.  Return of visit: 1 month.  Earlie Server, MD, PhD Hematology Oncology [place] at [facility] 08/23/2019

## 2019-08-31 ENCOUNTER — Telehealth: Payer: Self-pay | Admitting: *Deleted

## 2019-08-31 ENCOUNTER — Telehealth: Payer: Self-pay

## 2019-08-31 NOTE — Telephone Encounter (Signed)
Dental clearance letter received from [facility]. Per Dr. Tasia Catchings, add zometa to next office visit on 11/18.

## 2019-08-31 NOTE — Telephone Encounter (Signed)
Called pt to make her aware that Dr. Tasia Catchings wants her to have *NEW* Zometa added on the her 09/20/19 lab/MD appt. I was unable to reach her by phone. Appt letter will be mailed out.

## 2019-09-07 ENCOUNTER — Other Ambulatory Visit: Payer: Self-pay

## 2019-09-07 ENCOUNTER — Ambulatory Visit
Admission: RE | Admit: 2019-09-07 | Discharge: 2019-09-07 | Disposition: A | Discharge status: Home / Self Care | Payer: Medicare Other | Source: Ambulatory Visit | Attending: Radiation Oncology | Admitting: Radiation Oncology

## 2019-09-07 ENCOUNTER — Encounter: Payer: Self-pay | Admitting: Radiation Oncology

## 2019-09-07 VITALS — BP 133/82 | HR 76 | Temp 98.0°F | Resp 18 | Wt 224.8 lb

## 2019-09-07 DIAGNOSIS — D0511 Intraductal carcinoma in situ of right breast: Secondary | ICD-10-CM | POA: Diagnosis present

## 2019-09-07 DIAGNOSIS — Z170 Estrogen receptor positive status [ER+]: Secondary | ICD-10-CM | POA: Diagnosis not present

## 2019-09-07 DIAGNOSIS — Z79811 Long term (current) use of aromatase inhibitors: Secondary | ICD-10-CM | POA: Diagnosis not present

## 2019-09-07 DIAGNOSIS — Z923 Personal history of irradiation: Secondary | ICD-10-CM | POA: Insufficient documentation

## 2019-09-07 NOTE — Progress Notes (Signed)
Radiation Oncology Follow up Note  Name: Jane Gardner   Date:   09/07/2019 MRN:  [id number] DOB: 1956-01-27    This 63 y.o. female presents to the clinic today for 1 month follow-up status post whole breast radiation to her right breast for ER/PR positive ductal carcinoma in situ.  REFERRING PROVIDER: Hortencia Pilar, MD  HPI: Patient is a 63 year old female now at 1 month having completed whole breast radiation to her right breast for a stage 0 ductal carcinoma in situ ER/PR positive seen today in routine follow-up she is doing well.  She specifically denies breast tenderness cough or bone pain.  She has been started on.  Letrozole tolerating that well without side effect.  COMPLICATIONS OF TREATMENT: none  FOLLOW UP COMPLIANCE: keeps appointments   PHYSICAL EXAM:  BP 133/82   Pulse 76   Temp 98 F (36.7 C)   Resp 18   Wt 224 lb 12.8 oz (102 kg)   BMI 39.82 kg/m  Lungs are clear to A&P cardiac examination essentially unremarkable with regular rate and rhythm. No dominant mass or nodularity is noted in either breast in 2 positions examined. Incision is well-healed. No axillary or supraclavicular adenopathy is appreciated. Cosmetic result is excellent.  Well-developed well-nourished patient in NAD. HEENT reveals PERLA, EOMI, discs not visualized.  Oral cavity is clear. No oral mucosal lesions are identified. Neck is clear without evidence of cervical or supraclavicular adenopathy. Lungs are clear to A&P. Cardiac examination is essentially unremarkable with regular rate and rhythm without murmur rub or thrill. Abdomen is benign with no organomegaly or masses noted. Motor sensory and DTR levels are equal and symmetric in the upper and lower extremities. Cranial nerves II through XII are grossly intact. Proprioception is intact. No peripheral adenopathy or edema is identified. No motor or sensory levels are noted. Crude visual fields are within normal range.  RADIOLOGY RESULTS: No current  films to review  PLAN: Present time patient is doing extremely well 1 month out from whole breast radiation and pleased with her overall progress.  She continues on Femara without side effect.  I have asked to see her back in 4 to 5 months for follow-up.  She knows to call with any concerns.  I would like to take this opportunity to thank you for allowing me to participate in the care of your patient.Noreene Filbert, MD

## 2019-09-14 ENCOUNTER — Other Ambulatory Visit: Payer: Self-pay

## 2019-09-14 DIAGNOSIS — Z20822 Contact with and (suspected) exposure to covid-19: Secondary | ICD-10-CM

## 2019-09-16 LAB — NOVEL CORONAVIRUS, NAA: SARS-CoV-2, NAA: NOT DETECTED

## 2019-09-20 ENCOUNTER — Encounter: Payer: Self-pay | Admitting: Oncology

## 2019-09-20 ENCOUNTER — Inpatient Hospital Stay: Payer: Medicare Other | Attending: Oncology

## 2019-09-20 ENCOUNTER — Inpatient Hospital Stay (HOSPITAL_BASED_OUTPATIENT_CLINIC_OR_DEPARTMENT_OTHER): Payer: Medicare Other | Admitting: Oncology

## 2019-09-20 ENCOUNTER — Inpatient Hospital Stay: Payer: Medicare Other

## 2019-09-20 ENCOUNTER — Other Ambulatory Visit: Payer: Self-pay

## 2019-09-20 VITALS — BP 105/71 | HR 73 | Temp 98.5°F | Resp 18 | Wt 221.2 lb

## 2019-09-20 DIAGNOSIS — D0511 Intraductal carcinoma in situ of right breast: Secondary | ICD-10-CM | POA: Diagnosis not present

## 2019-09-20 DIAGNOSIS — Z79811 Long term (current) use of aromatase inhibitors: Secondary | ICD-10-CM | POA: Insufficient documentation

## 2019-09-20 DIAGNOSIS — Z809 Family history of malignant neoplasm, unspecified: Secondary | ICD-10-CM | POA: Diagnosis not present

## 2019-09-20 DIAGNOSIS — Z170 Estrogen receptor positive status [ER+]: Secondary | ICD-10-CM | POA: Insufficient documentation

## 2019-09-20 DIAGNOSIS — M8580 Other specified disorders of bone density and structure, unspecified site: Secondary | ICD-10-CM | POA: Insufficient documentation

## 2019-09-20 LAB — CBC WITH DIFFERENTIAL/PLATELET
Abs Immature Granulocytes: 0.01 10*3/uL (ref 0.00–0.07)
Basophils Absolute: 0 10*3/uL (ref 0.0–0.1)
Basophils Relative: 1 %
Eosinophils Absolute: 0.1 10*3/uL (ref 0.0–0.5)
Eosinophils Relative: 3 %
HCT: 38.8 % (ref 36.0–46.0)
Hemoglobin: 13 g/dL (ref 12.0–15.0)
Immature Granulocytes: 0 %
Lymphocytes Relative: 23 %
Lymphs Abs: 1.1 10*3/uL (ref 0.7–4.0)
MCH: 31.2 pg (ref 26.0–34.0)
MCHC: 33.5 g/dL (ref 30.0–36.0)
MCV: 93 fL (ref 80.0–100.0)
Monocytes Absolute: 0.6 10*3/uL (ref 0.1–1.0)
Monocytes Relative: 12 %
Neutro Abs: 3 10*3/uL (ref 1.7–7.7)
Neutrophils Relative %: 61 %
Platelets: 257 10*3/uL (ref 150–400)
RBC: 4.17 MIL/uL (ref 3.87–5.11)
RDW: 12.7 % (ref 11.5–15.5)
WBC: 4.8 10*3/uL (ref 4.0–10.5)
nRBC: 0 % (ref 0.0–0.2)

## 2019-09-20 LAB — COMPREHENSIVE METABOLIC PANEL
ALT: 31 U/L (ref 0–44)
AST: 33 U/L (ref 15–41)
Albumin: 4 g/dL (ref 3.5–5.0)
Alkaline Phosphatase: 92 U/L (ref 38–126)
Anion gap: 6 (ref 5–15)
BUN: 19 mg/dL (ref 8–23)
CO2: 25 mmol/L (ref 22–32)
Calcium: 9 mg/dL (ref 8.9–10.3)
Chloride: 106 mmol/L (ref 98–111)
Creatinine, Ser: 0.81 mg/dL (ref 0.44–1.00)
GFR calc Af Amer: >60 mL/min (ref >60)
GFR calc non Af Amer: >60 mL/min (ref >60)
Glucose, Bld: 103 mg/dL — ABNORMAL HIGH (ref 70–99)
Potassium: 4 mmol/L (ref 3.5–5.1)
Sodium: 137 mmol/L (ref 135–145)
Total Bilirubin: 0.8 mg/dL (ref 0.3–1.2)
Total Protein: 7 g/dL (ref 6.5–8.1)

## 2019-09-20 MED ORDER — SODIUM CHLORIDE 0.9 % IV SOLN
Freq: Once | INTRAVENOUS | Status: AC
  Administered 2019-09-20: 15:00:00 via INTRAVENOUS
  Filled 2019-09-20: qty 250

## 2019-09-20 MED ORDER — ZOLEDRONIC ACID 4 MG/100ML IV SOLN
4.0000 mg | Freq: Once | INTRAVENOUS | Status: AC
  Administered 2019-09-20: 15:00:00 4 mg via INTRAVENOUS
  Filled 2019-09-20: qty 100

## 2019-09-20 NOTE — Progress Notes (Signed)
Patient does not offer any problems today.  

## 2019-09-21 NOTE — Progress Notes (Signed)
Hematology/Oncology follow up  note [facility] [phone] [phone]   Patient Care Team: Hortencia Pilar, MD as PCP - General (Family Medicine)  REFERRING PROVIDER: Hortencia Pilar, MD  CHIEF COMPLAINTS/REASON FOR VISIT:  Follow up of DCIS  HISTORY OF PRESENTING ILLNESS:  Jane Gardner is a  63 y.o.  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  Estrogen receptor status, >90%  Nipple discharge: Denies Family history: Mother had history of breast cancer and bladder cancer.  Alive.  Maternal aunt has pancreatic cancer.  Maternal uncle has stomach cancer.  Maternal uncle has lung cancer. OCP use: Previous use of OCP Estrogen and progesterone therapy: Menopause in 29s, she was on hormone replacement for a few years. History of radiation to chest: denies.  No previous breast biopsy.  # 05/15/2019 patient underwent right lumpectomy with sentinel lymph node biopsy. 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  Regional lymph nodes uninvolved by tumor cells. pTis N0 05/24/2019 reexcision negative for DCIS. 08/09/2019 finished adjuvant radiation 08/23/2019 started on letrozole 2.5 mg INTERVAL HISTORY Jane Gardner is a 63 y.o. female who has above history reviewed by me today presents for follow up visit for management of DCIS Problems and complaints are listed below: Patient has started adjuvant letrozole 2.5 mg 4 months. Overall she reports  tolerating well. Manageable hot flashes and joint pain.  Radiation dermatitis has improved. Denies fever, chills, nausea, vomiting, diarrhea, chest pain, shortness of breath, abdominal pain, urinary symptoms, lower extremity swelling.    Review of Systems  Constitutional: Negative for appetite change, chills, fatigue and fever.  HENT:   Negative for hearing loss and voice change.   Eyes: Negative for eye problems.  Respiratory: Negative for chest tightness and cough.   Cardiovascular: Negative for chest pain.  Gastrointestinal: Negative for abdominal distention, abdominal pain and blood in stool.  Endocrine: Positive for hot flashes.  Genitourinary: Negative for difficulty urinating and frequency.   Musculoskeletal: Negative for arthralgias.  Skin: Negative for itching and rash.  Neurological: Negative for extremity weakness.  Hematological: Negative for adenopathy.  Psychiatric/Behavioral: Negative for confusion.    MEDICAL HISTORY:  Past Medical History:  Diagnosis Date  . Asthma   . Fibrocystic breast   . GERD (gastroesophageal reflux disease)   . Hyperlipemia   . Hypertension   . Osteopenia 08/23/2019  . Pneumonia     SURGICAL HISTORY: Past Surgical History:  Procedure Laterality Date  . BREAST BIOPSY Right 05/02/2019   right breast stereo bx of calcs, coil clip, path pending  . BREAST CYST ASPIRATION     ? side neg  . MASTECTOMY, PARTIAL Right 05/24/2019   Procedure: MASTECTOMY PARTIAL, REEXCISION OF RIGHT BREAST;  Surgeon: Herbert Pun, MD;  Location: [facility] ORS;  Service: General;  Laterality: Right;  . PARTIAL MASTECTOMY WITH NEEDLE LOCALIZATION Right 05/15/2019   Procedure: PARTIAL MASTECTOMY WITH NEEDLE LOCALIZATION AND SENTINEL NODE, RIGHT;  Surgeon: Herbert Pun, MD;  Location: [facility] ORS;  Service: General;  Laterality: Right;  . TONSILLECTOMY    . TONSILLECTOMY AND ADENOIDECTOMY    .  TUBAL LIGATION      SOCIAL HISTORY: Social History    Socioeconomic History  . Marital status: Married    Spouse name: Not on file  . Number of children: Not on file  . Years of education: Not on file  . Highest education level: Not on file  Occupational History  . Not on file  Social Needs  . Financial resource strain: Not on file  . Food insecurity    Worry: Not on file    Inability: Not on file  . Transportation needs    Medical: Not on file    Non-medical: Not on file  Tobacco Use  . Smoking status: Former Smoker    Packs/day: 1.00    Years: 3.00    Pack years: 3.00    Types: Cigarettes    Quit date: 1977    Years since quitting: 43.9  . Smokeless tobacco: Never Used  Substance and Sexual Activity  . Alcohol use: Yes    Comment: rare wine  . Drug use: No  . Sexual activity: Not on file  Lifestyle  . Physical activity    Days per week: Not on file    Minutes per session: Not on file  . Stress: Not on file  Relationships  . Social Herbalist on phone: Not on file    Gets together: Not on file    Attends religious service: Not on file    Active member of club or organization: Not on file    Attends meetings of clubs or organizations: Not on file    Relationship status: Not on file  . Intimate partner violence    Fear of current or ex partner: Not on file    Emotionally abused: Not on file    Physically abused: Not on file    Forced sexual activity: Not on file  Other Topics Concern  . Not on file  Social History Narrative  . Not on file    FAMILY HISTORY: Family History  Problem Relation Age of Onset  . Hypertension Mother   . Breast cancer Mother 57  . Hyperlipidemia Mother   . Bladder Cancer Mother   . Diabetes Father   . Pancreatic cancer Maternal Aunt   . Stomach cancer Maternal Uncle   . Diabetes Paternal Grandmother   . Lung cancer Maternal Uncle     ALLERGIES:  is allergic to hyoscyamine sulfate; latex; and other.  MEDICATIONS:  Current Outpatient Medications  Medication Sig  Dispense Refill  . albuterol (PROVENTIL HFA;VENTOLIN HFA) 108 (90 Base) MCG/ACT inhaler Inhale 1-2 puffs into the lungs every 6 (six) hours as needed for wheezing or shortness of breath. 1 Inhaler 0  . Ascorbic Acid (VITAMIN C) 1000 MG tablet Take 1 tablet by mouth daily.    Marland Kitchen atorvastatin (LIPITOR) 40 MG tablet Take 40 mg by mouth daily.    . Biotin 5000 MCG CAPS Take by mouth.    . cetirizine (ZYRTEC) 10 MG tablet Take 10 mg by mouth daily.    . Cholecalciferol (VITAMIN D3) 25 MCG (1000 UT) CAPS Take by mouth.    . co-enzyme Q-10 50 MG capsule Take 100 mg by mouth daily.    Marland Kitchen ELDERBERRY PO Take by mouth.    . famotidine (PEPCID) 20 MG tablet Take by mouth.    . fluticasone (FLONASE) 50 MCG/ACT nasal spray Place 2 sprays into both nostrils daily. 16 g 2  . letrozole (FEMARA) 2.5 MG tablet Take 1 tablet (  2.5 mg total) by mouth daily. 90 tablet 0  . lisinopril (ZESTRIL) 10 MG tablet Take 10 mg by mouth daily.    . Misc Natural Products (GLUCOSAMINE CHONDROITIN ADV PO) Take by mouth.     No current facility-administered medications for this visit.      PHYSICAL EXAMINATION: ECOG PERFORMANCE STATUS: 0 - Asymptomatic Vitals:   09/20/19 1419  BP: 105/71  Pulse: 73  Resp: 18  Temp: 98.5 F (36.9 C)   Filed Weights   09/20/19 1419  Weight: 221 lb 3.2 oz (100.3 kg)    Physical Exam Constitutional:      General: She is not in acute distress. HENT:     Head: Normocephalic and atraumatic.  Eyes:     General: No scleral icterus.    Pupils: Pupils are equal, round, and reactive to light.  Neck:     Musculoskeletal: Normal range of motion and neck supple.  Cardiovascular:     Rate and Rhythm: Normal rate and regular rhythm.     Heart sounds: Normal heart sounds.  Pulmonary:     Effort: Pulmonary effort is normal. No respiratory distress.     Breath sounds: No wheezing.  Abdominal:     General: Bowel sounds are normal. There is no distension.     Palpations: Abdomen is soft.  There is no mass.     Tenderness: There is no abdominal tenderness.  Musculoskeletal: Normal range of motion.        General: No deformity.  Skin:    General: Skin is warm and dry.     Findings: No erythema or rash.  Neurological:     Mental Status: She is alert and oriented to person, place, and time.     Cranial Nerves: No cranial nerve deficit.     Coordination: Coordination normal.  Psychiatric:        Behavior: Behavior normal.        Thought Content: Thought content normal.   Right breast status post lumpectomy and sentinel lymph node biopsy.  No erythema or discharge of surgical site.  Healing well.   LABORATORY DATA:  I have reviewed the data as listed Lab Results  Component Value Date   WBC 4.8 09/20/2019   HGB 13.0 09/20/2019   HCT 38.8 09/20/2019   MCV 93.0 09/20/2019   PLT 257 09/20/2019   Recent Labs    08/23/19 1310 09/20/19 1401  NA 142 137  K 4.3 4.0  CL 108 106  CO2 27 25  GLUCOSE 108* 103*  BUN 15 19  CREATININE 0.75 0.81  CALCIUM 9.3 9.0  GFRNONAA >60 >60  GFRAA >60 >60  PROT 7.0 7.0  ALBUMIN 4.0 4.0  AST 22 33  ALT 23 31  ALKPHOS 85 92  BILITOT 0.8 0.8   Iron/TIBC/Ferritin/ %Sat No results found for: IRON, TIBC, FERRITIN, IRONPCTSAT   Patient had labs done at [place] clinic on 05/09/2019.  Labs are reviewed.  RADIOGRAPHIC STUDIES: I have personally reviewed the radiological images as listed and agreed with the findings in the report. Dg Bone Density  Result Date: 08/21/2019 EXAM: DUAL X-RAY ABSORPTIOMETRY (DXA) FOR BONE MINERAL DENSITY IMPRESSION: Technologist: Marquette Old Your patient Jane Gardner completed a BMD test on 08/21/2019 using the [place] (analysis version: 14.10) manufactured by [employer]. The following summarizes the results of our evaluation. PATIENT BIOGRAPHICAL: Name: Jane Gardner, Jane Gardner Patient ID: [id number] Birth Date: Jul 31, 1956 Height: 63.0 in. Gender: Female Exam Date: 08/21/2019 Weight: 224.8 lbs.  Indications: Caucasian,  History of Breast Cancer, History of Fracture (Adult), History of Radiation, Postmenopausal Fractures: Left foot Treatments: Vitamin D ASSESSMENT: The BMD measured at Femur Neck Right is 0.717 g/cm2 with a T-score of -2.3. This patient is considered osteopenic according to [facility]) criteria. L-3 & 4 was excluded due to degenerative changes. The scan quality is good. Site Region Measured Measured WHO Young Adult BMD Date       Age      Classification T-score AP Spine L1-L2 08/21/2019 65.5 Osteopenia -1.3 1.014 g/cm2 DualFemur Neck Right 08/21/2019 65.5 Osteopenia -2.3 0.717 g/cm2 World Health Organization [facility]) criteria for post-menopausal, Caucasian Women: Normal:       T-score at or above -1 SD Osteopenia:   T-score between -1 and -2.5 SD Osteoporosis: T-score at or below -2.5 SD RECOMMENDATIONS: 1. All patients should optimize calcium and vitamin D intake. 2. Consider FDA-approved medical therapies in postmenopausal women and men aged 75 years and older, based on the following: a. A hip or vertebral(clinical or morphometric) fracture b. T-score < -2.5 at the femoral neck or spine after appropriate evaluation to exclude secondary causes c. Low bone mass (T-score between -1.0 and -2.5 at the femoral neck or spine) and a 10-year probability of a hip fracture > 3% or a 10-year probability of a major osteoporosis-related fracture > 20% based on the [place]-adapted WHO algorithm d. Clinician judgment and/or patient preferences may indicate treatment for people with 10-year fracture probabilities above or below these levels FOLLOW-UP: People with diagnosed cases of osteoporosis or at high risk for fracture should have regular bone mineral density tests. For patients eligible for Medicare, routine testing is allowed once every 2 years. The testing frequency can be increased to one year for patients who have rapidly progressing disease, those who are receiving or discontinuing  medical therapy to restore bone mass, or have additional risk factors. I have reviewed this report, and agree with the above findings. [facility] Dear Dr. Tasia Catchings, Your patient Jane Gardner completed a FRAX assessment on 08/21/2019 using the [place] (analysis version: 14.10) manufactured by [employer]. The following summarizes the results of our evaluation. PATIENT BIOGRAPHICAL: Name: Jane Gardner, Jane Gardner Patient ID: [id number] Birth Date: 1956/08/11 Height:    63.0 in. Gender:     Female    [age]       Weight:    224.8 lbs. Ethnicity:  White                            Exam Date: 08/21/2019 FRAX* RESULTS:  (version: 3.5) 10-year Probability of Fracture1 Major Osteoporotic Fracture2 Hip Fracture 17.8% 3.2% Population: [place] (Caucasian) Risk Factors: History of Fracture (Adult) Based on Femur (Right) Neck BMD 1 -The 10-year probability of fracture may be lower than reported if the patient has received treatment. 2 -Major Osteoporotic Fracture: Clinical Spine, Forearm, Hip or Shoulder *FRAX is a Materials engineer of the [employer] for Metabolic Bone Disease, a [place] (WHO) [facility]. ASSESSMENT: The probability of a major osteoporotic fracture is 17.8% within the next ten years. The probability of a hip fracture is 3.2% within the next ten years. . Electronically Signed   By: Lowella Grip III M.D.   On: 08/21/2019 13:36    ASSESSMENT & PLAN:  1. Ductal carcinoma in situ (DCIS) of right breast   2. Osteopenia, unspecified location   3.  Family history of cancer   High-grade DCIS, ER positive, status post lumpectomy, and adjuvant radiation. Currently on adjuvant antiestrogen treatment with letrozole 2.5 mg.  She tolerates well. Continue.  Plan 5 years.  #Osteopenia, results were reviewed and discussed. Recommend patient continue take calcium and vitamin D supplementation.   Exercise as tolerated Patient has obtain  dental clearance [scanned into epic's] to proceed with bisphosphonate. Plan Zometa 4 mg every 6 months. #Patient has family history of breast cancer, pancreatic cancer, stomach cancer. Recommend patient to consider about genetic testing.  She will update me if she wants to proceed with genetic testing.   All questions were answered. The patient knows to call the clinic with any problems questions or concerns.  Return of visit: 3 months.  Earlie Server, MD, PhD Hematology Oncology [place] at [facility] 09/21/2019

## 2019-11-12 ENCOUNTER — Other Ambulatory Visit: Payer: Self-pay | Admitting: Oncology

## 2019-12-16 ENCOUNTER — Ambulatory Visit: Payer: Medicare Other | Attending: Internal Medicine

## 2019-12-16 DIAGNOSIS — Z23 Encounter for immunization: Secondary | ICD-10-CM | POA: Insufficient documentation

## 2019-12-16 NOTE — Progress Notes (Signed)
   Covid-19 Vaccination Clinic  Name:  Jane Gardner    MRN: [id number] DOB: [birth year]  12/16/2019  Jane Gardner was observed post Covid-19 immunization for 15 minutes without incidence. She was provided with Vaccine Information Sheet and instruction to access the V-Safe system.   Jane Gardner was instructed to call 911 with any severe reactions post vaccine: Marland Kitchen Difficulty breathing  . Swelling of your face and throat  . A fast heartbeat  . A bad rash all over your body  . Dizziness and weakness    Immunizations Administered    Name Date Dose VIS Date Route   Pfizer COVID-19 Vaccine 12/16/2019  9:04 AM 0.3 mL 10/13/2019 Intramuscular   Manufacturer: [place]   Lot: [id number]   [place]ell: [id number]

## 2019-12-19 ENCOUNTER — Other Ambulatory Visit: Payer: Self-pay

## 2019-12-19 ENCOUNTER — Inpatient Hospital Stay: Payer: Medicare Other | Attending: Oncology

## 2019-12-19 DIAGNOSIS — M8580 Other specified disorders of bone density and structure, unspecified site: Secondary | ICD-10-CM | POA: Insufficient documentation

## 2019-12-19 DIAGNOSIS — D0511 Intraductal carcinoma in situ of right breast: Secondary | ICD-10-CM | POA: Diagnosis not present

## 2019-12-19 LAB — CBC WITH DIFFERENTIAL/PLATELET
Abs Immature Granulocytes: 0.02 10*3/uL (ref 0.00–0.07)
Basophils Absolute: 0 10*3/uL (ref 0.0–0.1)
Basophils Relative: 0 %
Eosinophils Absolute: 0.1 10*3/uL (ref 0.0–0.5)
Eosinophils Relative: 2 %
HCT: 39.8 % (ref 36.0–46.0)
Hemoglobin: 12.8 g/dL (ref 12.0–15.0)
Immature Granulocytes: 0 %
Lymphocytes Relative: 24 %
Lymphs Abs: 1.2 10*3/uL (ref 0.7–4.0)
MCH: 30.7 pg (ref 26.0–34.0)
MCHC: 32.2 g/dL (ref 30.0–36.0)
MCV: 95.4 fL (ref 80.0–100.0)
Monocytes Absolute: 0.5 10*3/uL (ref 0.1–1.0)
Monocytes Relative: 10 %
Neutro Abs: 3.2 10*3/uL (ref 1.7–7.7)
Neutrophils Relative %: 64 %
Platelets: 239 10*3/uL (ref 150–400)
RBC: 4.17 MIL/uL (ref 3.87–5.11)
RDW: 12.6 % (ref 11.5–15.5)
WBC: 5.1 10*3/uL (ref 4.0–10.5)
nRBC: 0 % (ref 0.0–0.2)

## 2019-12-19 LAB — COMPREHENSIVE METABOLIC PANEL
ALT: 24 U/L (ref 0–44)
AST: 21 U/L (ref 15–41)
Albumin: 3.6 g/dL (ref 3.5–5.0)
Alkaline Phosphatase: 71 U/L (ref 38–126)
Anion gap: 9 (ref 5–15)
BUN: 21 mg/dL (ref 8–23)
CO2: 25 mmol/L (ref 22–32)
Calcium: 9.1 mg/dL (ref 8.9–10.3)
Chloride: 108 mmol/L (ref 98–111)
Creatinine, Ser: 0.93 mg/dL (ref 0.44–1.00)
GFR calc Af Amer: >60 mL/min (ref >60)
GFR calc non Af Amer: >60 mL/min (ref >60)
Glucose, Bld: 148 mg/dL — ABNORMAL HIGH (ref 70–99)
Potassium: 3.6 mmol/L (ref 3.5–5.1)
Sodium: 142 mmol/L (ref 135–145)
Total Bilirubin: 0.6 mg/dL (ref 0.3–1.2)
Total Protein: 6.3 g/dL — ABNORMAL LOW (ref 6.5–8.1)

## 2019-12-20 ENCOUNTER — Other Ambulatory Visit: Payer: Medicare Other

## 2019-12-20 ENCOUNTER — Inpatient Hospital Stay (HOSPITAL_BASED_OUTPATIENT_CLINIC_OR_DEPARTMENT_OTHER): Payer: Medicare Other | Admitting: Oncology

## 2019-12-20 DIAGNOSIS — D0511 Intraductal carcinoma in situ of right breast: Secondary | ICD-10-CM

## 2019-12-20 DIAGNOSIS — Z809 Family history of malignant neoplasm, unspecified: Secondary | ICD-10-CM

## 2019-12-20 DIAGNOSIS — R1011 Right upper quadrant pain: Secondary | ICD-10-CM | POA: Diagnosis not present

## 2019-12-20 DIAGNOSIS — M8580 Other specified disorders of bone density and structure, unspecified site: Secondary | ICD-10-CM | POA: Diagnosis not present

## 2019-12-20 MED ORDER — LETROZOLE 2.5 MG PO TABS: 2.5000 mg | ORAL_TABLET | Freq: Every day | ORAL | 3 refills | Status: DC

## 2019-12-20 NOTE — Progress Notes (Signed)
Patient verified using two identifiers for virtual visit via telephone today.  Patient does not offer any problems today.  

## 2019-12-21 ENCOUNTER — Encounter: Payer: Self-pay | Admitting: Oncology

## 2019-12-21 NOTE — Progress Notes (Signed)
HEMATOLOGY-ONCOLOGY TeleHEALTH VISIT PROGRESS NOTE  I connected with Jane Gardner on 12/21/19 at  2:15 PM EST by video enabled telemedicine visit and verified that I am speaking with the correct person using two identifiers. I discussed the limitations, risks, security and privacy concerns of performing an evaluation and management service by telemedicine and the availability of in-person appointments. I also discussed with the patient that there may be a patient responsible charge related to this service. The patient expressed understanding and agreed to proceed.   Other persons participating in the visit and their role in the encounter:  None  Patient's location: Home  Provider's location: office Chief Complaint: Follow-up for DCIS   INTERVAL HISTORY Jane Gardner is a 64 y.o. female who has above history reviewed by me today presents for follow up visit for management of DCIS Problems and complaints are listed below: Patient Patient has been taking letrozole 2.5 mg daily.  She reports tolerating well.  Manageable hot flash. Intermittent right upper quadrant pain recently.  No exacerbating or alleviating factors. She recalls having similar symptoms in the past and had ultrasound done.  Review of Systems  Constitutional: Negative for appetite change, chills, fatigue and fever.  HENT:   Negative for hearing loss and voice change.   Eyes: Negative for eye problems.  Respiratory: Negative for chest tightness and cough.   Cardiovascular: Negative for chest pain.  Gastrointestinal: Negative for abdominal distention, abdominal pain and blood in stool.  Endocrine: Positive for hot flashes.  Genitourinary: Negative for difficulty urinating and frequency.   Musculoskeletal: Negative for arthralgias.  Skin: Negative for itching and rash.  Neurological: Negative for extremity weakness.  Hematological: Negative for adenopathy.  Psychiatric/Behavioral: Negative for confusion.    Past Medical  History:  Diagnosis Date  . Asthma   . Fibrocystic breast   . GERD (gastroesophageal reflux disease)   . Hyperlipemia   . Hypertension   . Osteopenia 08/23/2019  . Pneumonia    Past Surgical History:  Procedure Laterality Date  . BREAST BIOPSY Right 05/02/2019   right breast stereo bx of calcs, coil clip, path pending  . BREAST CYST ASPIRATION     ? side neg  . MASTECTOMY, PARTIAL Right 05/24/2019   Procedure: MASTECTOMY PARTIAL, REEXCISION OF RIGHT BREAST;  Surgeon: Herbert Pun, MD;  Location: [facility] ORS;  Service: General;  Laterality: Right;  . PARTIAL MASTECTOMY WITH NEEDLE LOCALIZATION Right 05/15/2019   Procedure: PARTIAL MASTECTOMY WITH NEEDLE LOCALIZATION AND SENTINEL NODE, RIGHT;  Surgeon: Herbert Pun, MD;  Location: [facility] ORS;  Service: General;  Laterality: Right;  . TONSILLECTOMY    . TONSILLECTOMY AND ADENOIDECTOMY    . TUBAL LIGATION      Family History  Problem Relation Age of Onset  . Hypertension Mother   . Breast cancer Mother 36  . Hyperlipidemia Mother   . Bladder Cancer Mother   . Diabetes Father   . Pancreatic cancer Maternal Aunt   . Stomach cancer Maternal Uncle   . Diabetes Paternal Grandmother   . Lung cancer Maternal Uncle     Social History   Socioeconomic History  . Marital status: Married    Spouse name: Not on file  . Number of children: Not on file  . Years of education: Not on file  . Highest education level: Not on file  Occupational History  . Not on file  Tobacco Use  . Smoking status: Former Smoker    Packs/day: 1.00    Years: 3.00    Pack  years: 3.00    Types: Cigarettes    Quit date: 1977    Years since quitting: 44.1  . Smokeless tobacco: Never Used  Substance and Sexual Activity  . Alcohol use: Yes    Comment: rare wine  . Drug use: No  . Sexual activity: Not on file  Other Topics Concern  . Not on file  Social History Narrative  . Not on file   Social Determinants of Health   Financial Resource  Strain:   . Difficulty of Paying Living Expenses: Not on file  Food Insecurity:   . Worried About Charity fundraiser in the Last Year: Not on file  . Ran Out of Food in the Last Year: Not on file  Transportation Needs:   . Lack of Transportation (Medical): Not on file  . Lack of Transportation (Non-Medical): Not on file  Physical Activity:   . Days of Exercise per Week: Not on file  . Minutes of Exercise per Session: Not on file  Stress:   . Feeling of Stress : Not on file  Social Connections:   . Frequency of Communication with Friends and Family: Not on file  . Frequency of Social Gatherings with Friends and Family: Not on file  . Attends Religious Services: Not on file  . Active Member of Clubs or Organizations: Not on file  . Attends Archivist Meetings: Not on file  . Marital Status: Not on file  Intimate Partner Violence:   . Fear of Current or Ex-Partner: Not on file  . Emotionally Abused: Not on file  . Physically Abused: Not on file  . Sexually Abused: Not on file    Current Outpatient Medications on File Prior to Visit  Medication Sig Dispense Refill  . albuterol (PROVENTIL HFA;VENTOLIN HFA) 108 (90 Base) MCG/ACT inhaler Inhale 1-2 puffs into the lungs every 6 (six) hours as needed for wheezing or shortness of breath. 1 Inhaler 0  . Ascorbic Acid (VITAMIN C) 1000 MG tablet Take 1 tablet by mouth daily.    Marland Kitchen atorvastatin (LIPITOR) 40 MG tablet Take 40 mg by mouth daily.    . Biotin 5000 MCG CAPS Take by mouth.    . cetirizine (ZYRTEC) 10 MG tablet Take 10 mg by mouth daily.    . Cholecalciferol (VITAMIN D3) 25 MCG (1000 UT) CAPS Take by mouth.    . co-enzyme Q-10 50 MG capsule Take 100 mg by mouth daily.    Marland Kitchen ELDERBERRY PO Take by mouth.    . famotidine (PEPCID) 20 MG tablet Take by mouth.    . fluticasone (FLONASE) 50 MCG/ACT nasal spray Place 2 sprays into both nostrils daily. 16 g 2  . lisinopril (ZESTRIL) 10 MG tablet Take 10 mg by mouth daily.    . Misc  Natural Products (GLUCOSAMINE CHONDROITIN ADV PO) Take by mouth.     No current facility-administered medications on file prior to visit.    Allergies  Allergen Reactions  . Hyoscyamine Sulfate Other (See Comments)    Other Reaction: "MAKES ME FEEL BAD, LIPS NUMB"  . Latex Hives  . Other Rash    tegaderm       Observations/Objective: Today's Vitals   12/20/19 1443  PainSc: 0-No pain   There is no height or weight on file to calculate BMI.  Physical Exam  Constitutional: No distress.  Neurological: She is alert.    CBC    Component Value Date/Time   WBC 5.1 12/19/2019 1100   RBC  4.17 12/19/2019 1100   HGB 12.8 12/19/2019 1100   HCT 39.8 12/19/2019 1100   PLT 239 12/19/2019 1100   MCV 95.4 12/19/2019 1100   MCH 30.7 12/19/2019 1100   MCHC 32.2 12/19/2019 1100   RDW 12.6 12/19/2019 1100   LYMPHSABS 1.2 12/19/2019 1100   MONOABS 0.5 12/19/2019 1100   EOSABS 0.1 12/19/2019 1100   BASOSABS 0.0 12/19/2019 1100    CMP     Component Value Date/Time   NA 142 12/19/2019 1100   K 3.6 12/19/2019 1100   CL 108 12/19/2019 1100   CO2 25 12/19/2019 1100   GLUCOSE 148 (H) 12/19/2019 1100   BUN 21 12/19/2019 1100   CREATININE 0.93 12/19/2019 1100   CALCIUM 9.1 12/19/2019 1100   PROT 6.3 (L) 12/19/2019 1100   ALBUMIN 3.6 12/19/2019 1100   AST 21 12/19/2019 1100   ALT 24 12/19/2019 1100   ALKPHOS 71 12/19/2019 1100   BILITOT 0.6 12/19/2019 1100   GFRNONAA >60 12/19/2019 1100   GFRAA >60 12/19/2019 1100     Assessment and Plan: 1. Ductal carcinoma in situ (DCIS) of right breast   2. Osteopenia, unspecified location   3. Family history of cancer   4. Right upper quadrant abdominal pain     Right breast high-grade DCIS, ER positive status post lumpectomy and adjuvant radiation. Currently on adjuvant antiestrogen treatment with letrozole 2.5 mg.  Clinically she tolerates well. Continue.  Labs reviewed and discussed with patient. Discussed with patient about total of 5  years of letrozole treatment. Will obtain annual diagnostic mammogram in June 2021.  #Osteopenia, recommend patient to continue calcium and vitamin D supplementation. She had Zometa treatments in November 2020.  Plan Zometa 4 mg every 6 months.  Next due May/June 2021.  #Family history of breast cancer, pancreatic cancer, stomach cancer, personal history of breast DCIS. We discussed about genetic testing and the patient is interested in discussing with genetic counselor.  Will refer.  #Intermittent right upper quadrant discomfort.  Patient had similar symptoms in the past and had ultrasound done which showed no cholelithiasis.  Discussed with patient that if her symptoms keep coming back, she should discuss with primary care provider for further management plan/repeat right upper quadrant ultrasound.  Follow Up Instructions: June 2021 diagnostic mammogram, lab MD Zometa after mammogram.   I discussed the assessment and treatment plan with the patient. The patient was provided an opportunity to ask questions and all were answered. The patient agreed with the plan and demonstrated an understanding of the instructions.  The patient was advised to call back or seek an in-person evaluation if the symptoms worsen or if the condition fails to improve as anticipated.   Earlie Server, MD 12/21/2019 9:38 AM

## 2019-12-24 ENCOUNTER — Encounter: Payer: Self-pay | Admitting: Oncology

## 2020-01-06 ENCOUNTER — Ambulatory Visit: Payer: Medicare Other | Attending: Internal Medicine

## 2020-01-06 ENCOUNTER — Other Ambulatory Visit: Payer: Self-pay

## 2020-01-06 DIAGNOSIS — Z23 Encounter for immunization: Secondary | ICD-10-CM | POA: Insufficient documentation

## 2020-01-06 NOTE — Progress Notes (Signed)
   Covid-19 Vaccination Clinic  Name:  Jane Gardner    MRN: [id number] DOB: [birth year]  01/06/2020  Jane Gardner was observed post Covid-19 immunization for 15 minutes without incident. She was provided with Vaccine Information Sheet and instruction to access the V-Safe system.   Jane Gardner was instructed to call 911 with any severe reactions post vaccine: Marland Kitchen Difficulty breathing  . Swelling of face and throat  . A fast heartbeat  . A bad rash all over body  . Dizziness and weakness   Immunizations Administered    Name Date Dose VIS Date Route   Pfizer COVID-19 Vaccine 01/06/2020  8:22 AM 0.3 mL 10/13/2019 Intramuscular   Manufacturer: [employer]   Lot: [id number]   [place]: [id number]

## 2020-02-15 ENCOUNTER — Encounter: Payer: Self-pay | Admitting: Radiation Oncology

## 2020-02-15 ENCOUNTER — Other Ambulatory Visit: Payer: Self-pay

## 2020-02-15 ENCOUNTER — Ambulatory Visit
Admission: RE | Admit: 2020-02-15 | Discharge: 2020-02-15 | Disposition: A | Discharge status: Home / Self Care | Payer: Medicare Other | Source: Ambulatory Visit | Attending: Radiation Oncology | Admitting: Radiation Oncology

## 2020-02-15 VITALS — BP 123/88 | HR 78 | Temp 95.2°F | Resp 16 | Wt 230.9 lb

## 2020-02-15 DIAGNOSIS — Z923 Personal history of irradiation: Secondary | ICD-10-CM | POA: Diagnosis not present

## 2020-02-15 DIAGNOSIS — Z170 Estrogen receptor positive status [ER+]: Secondary | ICD-10-CM | POA: Insufficient documentation

## 2020-02-15 DIAGNOSIS — D0511 Intraductal carcinoma in situ of right breast: Secondary | ICD-10-CM | POA: Insufficient documentation

## 2020-02-15 DIAGNOSIS — Z79811 Long term (current) use of aromatase inhibitors: Secondary | ICD-10-CM | POA: Diagnosis not present

## 2020-02-15 NOTE — Progress Notes (Signed)
Radiation Oncology Follow up Note  Name: Jane Gardner   Date:   02/15/2020 MRN:  [id number] DOB: 1956/09/26    This 64 y.o. female presents to the clinic today for 52-month follow-up status post whole breast radiation to her right breast for ER/PR positive ductal carcinoma in situ.  REFERRING PROVIDER: Hortencia Pilar, MD  HPI: Patient is a 64 year old female now at 6 months having completed whole breast radiation to her right breast for ER/PR positive ductal carcinoma in situ.  She is doing well she specifically denies breast tenderness cough or bone pain.  She is not yet had a follow-up mammogram..  She is currently on letrozole tolerating it well without side effect. COMPLICATIONS OF TREATMENT: none  FOLLOW UP COMPLIANCE: keeps appointments   PHYSICAL EXAM:  BP 123/88 (BP Location: Left Arm, Patient Position: Sitting, Cuff Size: Large)   Pulse 78   Temp (!) 95.2 F (35.1 C) (Tympanic)   Resp 16   Wt 230 lb 14.4 oz (104.7 kg)   BMI 40.90 kg/m  Lungs are clear to A&P cardiac examination essentially unremarkable with regular rate and rhythm. No dominant mass or nodularity is noted in either breast in 2 positions examined. Incision is well-healed. No axillary or supraclavicular adenopathy is appreciated. Cosmetic result is excellent.  Well-developed well-nourished patient in NAD. HEENT reveals PERLA, EOMI, discs not visualized.  Oral cavity is clear. No oral mucosal lesions are identified. Neck is clear without evidence of cervical or supraclavicular adenopathy. Lungs are clear to A&P. Cardiac examination is essentially unremarkable with regular rate and rhythm without murmur rub or thrill. Abdomen is benign with no organomegaly or masses noted. Motor sensory and DTR levels are equal and symmetric in the upper and lower extremities. Cranial nerves II through XII are grossly intact. Proprioception is intact. No peripheral adenopathy or edema is identified. No motor or sensory levels are noted.  Crude visual fields are within normal range.  RADIOLOGY RESULTS: No current films for review  PLAN: Present time she is doing extremely well with no evidence of disease 6 months out from whole breast radiation for DCIS.  I am pleased with her overall progress she continues on letrozole without side effect.  Patient knows to call with any concerns I will see her back in 6 months and then start once your follow-up visits.  I would like to take this opportunity to thank you for allowing me to participate in the care of your patient.Noreene Filbert, MD

## 2020-02-26 ENCOUNTER — Encounter: Payer: Self-pay | Admitting: Oncology

## 2020-02-26 NOTE — Telephone Encounter (Signed)
Done.. All appt has been sched and pt is aware.

## 2020-04-04 DIAGNOSIS — L570 Actinic keratosis: Secondary | ICD-10-CM | POA: Insufficient documentation

## 2020-04-19 ENCOUNTER — Ambulatory Visit
Admission: RE | Admit: 2020-04-19 | Discharge: 2020-04-19 | Disposition: A | Discharge status: Home / Self Care | Payer: Medicare Other | Source: Ambulatory Visit | Attending: Oncology | Admitting: Oncology

## 2020-04-19 DIAGNOSIS — D0511 Intraductal carcinoma in situ of right breast: Secondary | ICD-10-CM | POA: Diagnosis not present

## 2020-04-19 HISTORY — DX: Personal history of irradiation: Z92.3

## 2020-04-23 ENCOUNTER — Inpatient Hospital Stay: Payer: Medicare Other | Attending: Oncology

## 2020-04-23 ENCOUNTER — Inpatient Hospital Stay: Payer: Medicare Other

## 2020-04-23 ENCOUNTER — Other Ambulatory Visit: Payer: Self-pay

## 2020-04-23 ENCOUNTER — Encounter: Payer: Self-pay | Admitting: Oncology

## 2020-04-23 ENCOUNTER — Inpatient Hospital Stay (HOSPITAL_BASED_OUTPATIENT_CLINIC_OR_DEPARTMENT_OTHER): Payer: Medicare Other | Admitting: Oncology

## 2020-04-23 VITALS — BP 119/81 | HR 64 | Temp 96.7°F | Resp 18 | Wt 224.8 lb

## 2020-04-23 DIAGNOSIS — D0511 Intraductal carcinoma in situ of right breast: Secondary | ICD-10-CM

## 2020-04-23 DIAGNOSIS — Z79811 Long term (current) use of aromatase inhibitors: Secondary | ICD-10-CM | POA: Insufficient documentation

## 2020-04-23 DIAGNOSIS — R1011 Right upper quadrant pain: Secondary | ICD-10-CM

## 2020-04-23 DIAGNOSIS — Z170 Estrogen receptor positive status [ER+]: Secondary | ICD-10-CM | POA: Insufficient documentation

## 2020-04-23 DIAGNOSIS — Z809 Family history of malignant neoplasm, unspecified: Secondary | ICD-10-CM

## 2020-04-23 DIAGNOSIS — M8580 Other specified disorders of bone density and structure, unspecified site: Secondary | ICD-10-CM

## 2020-04-23 DIAGNOSIS — M2550 Pain in unspecified joint: Secondary | ICD-10-CM | POA: Diagnosis not present

## 2020-04-23 DIAGNOSIS — N951 Menopausal and female climacteric states: Secondary | ICD-10-CM | POA: Insufficient documentation

## 2020-04-23 LAB — COMPREHENSIVE METABOLIC PANEL
ALT: 23 U/L (ref 0–44)
AST: 22 U/L (ref 15–41)
Albumin: 3.9 g/dL (ref 3.5–5.0)
Alkaline Phosphatase: 75 U/L (ref 38–126)
Anion gap: 7 (ref 5–15)
BUN: 17 mg/dL (ref 8–23)
CO2: 26 mmol/L (ref 22–32)
Calcium: 9.5 mg/dL (ref 8.9–10.3)
Chloride: 108 mmol/L (ref 98–111)
Creatinine, Ser: 0.82 mg/dL (ref 0.44–1.00)
GFR calc Af Amer: >60 mL/min (ref >60)
GFR calc non Af Amer: >60 mL/min (ref >60)
Glucose, Bld: 118 mg/dL — ABNORMAL HIGH (ref 70–99)
Potassium: 4 mmol/L (ref 3.5–5.1)
Sodium: 141 mmol/L (ref 135–145)
Total Bilirubin: 0.7 mg/dL (ref 0.3–1.2)
Total Protein: 7 g/dL (ref 6.5–8.1)

## 2020-04-23 LAB — CBC WITH DIFFERENTIAL/PLATELET
Abs Immature Granulocytes: 0.02 10*3/uL (ref 0.00–0.07)
Basophils Absolute: 0 10*3/uL (ref 0.0–0.1)
Basophils Relative: 0 %
Eosinophils Absolute: 0.2 10*3/uL (ref 0.0–0.5)
Eosinophils Relative: 4 %
HCT: 40.2 % (ref 36.0–46.0)
Hemoglobin: 13.8 g/dL (ref 12.0–15.0)
Immature Granulocytes: 0 %
Lymphocytes Relative: 25 %
Lymphs Abs: 1.5 10*3/uL (ref 0.7–4.0)
MCH: 31.3 pg (ref 26.0–34.0)
MCHC: 34.3 g/dL (ref 30.0–36.0)
MCV: 91.2 fL (ref 80.0–100.0)
Monocytes Absolute: 0.6 10*3/uL (ref 0.1–1.0)
Monocytes Relative: 9 %
Neutro Abs: 3.6 10*3/uL (ref 1.7–7.7)
Neutrophils Relative %: 62 %
Platelets: 258 10*3/uL (ref 150–400)
RBC: 4.41 MIL/uL (ref 3.87–5.11)
RDW: 12.7 % (ref 11.5–15.5)
WBC: 5.9 10*3/uL (ref 4.0–10.5)
nRBC: 0 % (ref 0.0–0.2)

## 2020-04-23 MED ORDER — SODIUM CHLORIDE 0.9 % IV SOLN
Freq: Once | INTRAVENOUS | Status: AC
  Filled 2020-04-23: qty 250

## 2020-04-23 MED ORDER — ZOLEDRONIC ACID 4 MG/100ML IV SOLN
4.0000 mg | Freq: Once | INTRAVENOUS | Status: AC
  Administered 2020-04-23: 4 mg via INTRAVENOUS
  Filled 2020-04-23: qty 100

## 2020-04-23 NOTE — Progress Notes (Addendum)
Hematology/Oncology follow up  note [facility] [phone] [phone]   Patient Care Team: Hortencia Pilar, MD as PCP - General (Family Medicine) Noreene Filbert, MD as Radiation Oncologist (Radiation Oncology)  REFERRING PROVIDER: Hortencia Pilar, MD  CHIEF COMPLAINTS/REASON FOR VISIT:  Follow up of DCIS  HISTORY OF PRESENTING ILLNESS:  Jane Gardner is a  64 y.o.  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  Estrogen receptor status, >90%  Nipple discharge: Denies Family history: Mother had history of breast cancer and bladder cancer.  Alive.  Maternal aunt has pancreatic cancer.  Maternal uncle has stomach cancer.  Maternal uncle has lung cancer. OCP use: Previous use of OCP Estrogen and progesterone therapy: Menopause in 88s, she was on hormone replacement for a few years. History of radiation to chest: denies.  No previous breast biopsy.  # 05/15/2019 patient underwent right lumpectomy with sentinel lymph node biopsy. Pathology showed high-grade DCIS, 53mm, grade 3, posterior margin was positive for DCIS.  Regional lymph nodes uninvolved by tumor cells. pTis N0 05/24/2019 reexcision negative for DCIS. 08/09/2019 finished adjuvant radiation 08/23/2019 started on letrozole 2.5 mg INTERVAL HISTORY Jane Gardner is a 64 y.o. female who has above history reviewed by me today presents for follow up visit for management of DCIS Problems and complaints are listed below: Patient has been  on letrozole 2.5 mg daily since October 2020. Overall she tolerates well with manageable hot flashes and joint pain.   She reports having intermittent sharp pain at the site of previous lumpectomy.  Occasionally she experiences right upper extremity swelling. Denies any fever, chills, nausea, vomiting, diarrhea, back pain, chest pain, shortness of breath.   Review of Systems  Constitutional: Negative for appetite change, chills, fatigue and fever.  HENT:   Negative for hearing loss and voice change.   Eyes: Negative for eye problems.  Respiratory: Negative for chest tightness and cough.   Cardiovascular: Negative for chest pain.  Gastrointestinal: Negative for abdominal distention, abdominal pain and blood in stool.  Endocrine: Positive for hot flashes.  Genitourinary: Negative for difficulty urinating and frequency.   Musculoskeletal: Positive for arthralgias.  Skin: Negative for itching and rash.  Neurological: Negative for extremity weakness.  Hematological: Negative for adenopathy.  Psychiatric/Behavioral: Negative for confusion.    MEDICAL HISTORY:  Past Medical History:  Diagnosis Date  . Asthma   . 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 Fibrocystic breast   . GERD (gastroesophageal reflux disease)   . Hyperlipemia   . Hypertension   . Osteopenia 08/23/2019  . Personal history of radiation therapy   . Pneumonia     SURGICAL HISTORY: Past Surgical History:  Procedure Laterality Date  . BREAST BIOPSY Right 05/02/2019   right breast stereo bx of calcs, coil clip, path pending  . BREAST CYST ASPIRATION     ? side neg  . MASTECTOMY, PARTIAL Right 05/24/2019   Procedure: MASTECTOMY PARTIAL, REEXCISION OF RIGHT BREAST;  Surgeon: Herbert Pun, MD;  Location: [facility] ORS;  Service: General;  Laterality: Right;  . PARTIAL MASTECTOMY WITH NEEDLE LOCALIZATION Right  05/15/2019   Procedure: PARTIAL MASTECTOMY WITH NEEDLE LOCALIZATION AND SENTINEL NODE,  RIGHT;  Surgeon: Herbert Pun, MD;  Location: [facility] ORS;  Service: General;  Laterality: Right;  . TONSILLECTOMY    . TONSILLECTOMY AND ADENOIDECTOMY    . TUBAL LIGATION      SOCIAL HISTORY: Social History   Socioeconomic History  . Marital status: Married    Spouse name: Not on file  . Number of children: Not on file  . Years of education: Not on file  . Highest education level: Not on file  Occupational History  . Not on file  Tobacco Use  . Smoking status: Former Smoker    Packs/day: 1.00    Years: 3.00    Pack years: 3.00    Types: Cigarettes    Quit date: 1977    Years since quitting: 44.5  . Smokeless tobacco: Never Used  Vaping Use  . Vaping Use: Never used  Substance and Sexual Activity  . Alcohol use: Yes    Comment: rare wine  . Drug use: No  . Sexual activity: Not on file  Other Topics Concern  . Not on file  Social History Narrative  . Not on file   Social Determinants of Health   Financial Resource Strain:   . Difficulty of Paying Living Expenses:   Food Insecurity:   . Worried About Charity fundraiser in the Last Year:   . Arboriculturist in the Last Year:   Transportation Needs:   . Film/video editor (Medical):   Marland Kitchen Lack of Transportation (Non-Medical):   Physical Activity:   . Days of Exercise per Week:   . Minutes of Exercise per Session:   Stress:   . Feeling of Stress :   Social Connections:   . Frequency of Communication with Friends and Family:   . Frequency of Social Gatherings with Friends and Family:   . Attends Religious Services:   . Active Member of Clubs or Organizations:   . Attends Archivist Meetings:   Marland Kitchen Marital Status:   Intimate Partner Violence:   . Fear of Current or Ex-Partner:   . Emotionally Abused:   Marland Kitchen Physically Abused:   . Sexually Abused:     FAMILY HISTORY: Family History  Problem Relation Age of Onset  . Hypertension Mother   . Breast cancer Mother 69  . Hyperlipidemia Mother    . Bladder Cancer Mother   . Diabetes Father   . Pancreatic cancer Maternal Aunt   . Stomach cancer Maternal Uncle   . Diabetes Paternal Grandmother   . Lung cancer Maternal Uncle     ALLERGIES:  is allergic to hyoscyamine sulfate, latex, and other.  MEDICATIONS:  Current Outpatient Medications  Medication Sig Dispense Refill  . albuterol (PROVENTIL HFA;VENTOLIN HFA) 108 (90 Base) MCG/ACT inhaler Inhale 1-2 puffs into the lungs every 6 (six) hours as needed for wheezing or shortness of breath. 1 Inhaler 0  . Ascorbic Acid (VITAMIN C) 1000 MG tablet Take 1 tablet by mouth daily.    Marland Kitchen atorvastatin (LIPITOR) 40 MG tablet Take 40 mg by mouth daily.    . Biotin 5000 MCG CAPS Take by mouth.    . cetirizine (ZYRTEC) 10 MG tablet Take 10 mg by mouth daily.    . Cholecalciferol (VITAMIN D3) 25 MCG (1000 UT) CAPS Take by mouth.    . co-enzyme Q-10 50 MG capsule Take 100 mg by mouth daily.    Marland Kitchen ELDERBERRY PO  Take by mouth.    . famotidine (PEPCID) 20 MG tablet Take by mouth.    . fluticasone (FLONASE) 50 MCG/ACT nasal spray Place 2 sprays into both nostrils daily. 16 g 2  . KRILL OIL PO Take 1 capsule by mouth daily.    Marland Kitchen letrozole (FEMARA) 2.5 MG tablet Take 1 tablet (2.5 mg total) by mouth daily. 90 tablet 3  . lisinopril (ZESTRIL) 10 MG tablet Take 10 mg by mouth daily.    . Misc Natural Products (GLUCOSAMINE CHONDROITIN ADV PO) Take by mouth.     No current facility-administered medications for this visit.     PHYSICAL EXAMINATION: ECOG PERFORMANCE STATUS: 0 - Asymptomatic Vitals:   04/23/20 1312  BP: 119/81  Pulse: 64  Resp: 18  Temp: (!) 96.7 F (35.9 C)   Filed Weights   04/23/20 1312  Weight: 224 lb 12.8 oz (102 kg)    Physical Exam Constitutional:      General: She is not in acute distress. HENT:     Head: Normocephalic and atraumatic.  Eyes:     General: No scleral icterus.    Pupils: Pupils are equal, round, and reactive to light.  Cardiovascular:     Rate and  Rhythm: Normal rate and regular rhythm.     Heart sounds: Normal heart sounds.  Pulmonary:     Effort: Pulmonary effort is normal. No respiratory distress.     Breath sounds: No wheezing.  Abdominal:     General: Bowel sounds are normal. There is no distension.     Palpations: Abdomen is soft. There is no mass.     Tenderness: There is no abdominal tenderness.  Musculoskeletal:        General: No deformity. Normal range of motion.     Cervical back: Normal range of motion and neck supple.     Comments: Left lower extremity edema which patient reports being a chronic problem for her.  No calf tenderness  Skin:    General: Skin is warm and dry.     Findings: No erythema or rash.  Neurological:     Mental Status: She is alert and oriented to person, place, and time. Mental status is at baseline.     Cranial Nerves: No cranial nerve deficit.     Coordination: Coordination normal.  Psychiatric:        Mood and Affect: Mood normal.   Right breast status post lumpectomy and sentinel lymph node biopsy.   She has tissue hardening around the lumpectomy scar.  No other palpable discrete mass in bilateral breast. No palpable axillary lymphadenopathy  LABORATORY DATA:  I have reviewed the data as listed Lab Results  Component Value Date   WBC 5.9 04/23/2020   HGB 13.8 04/23/2020   HCT 40.2 04/23/2020   MCV 91.2 04/23/2020   PLT 258 04/23/2020   Recent Labs    09/20/19 1401 12/19/19 1100 04/23/20 1254  NA 137 142 141  K 4.0 3.6 4.0  CL 106 108 108  CO2 25 25 26   GLUCOSE 103* 148* 118*  BUN 19 21 17   CREATININE 0.81 0.93 0.82  CALCIUM 9.0 9.1 9.5  GFRNONAA >60 >60 >60  GFRAA >60 >60 >60  PROT 7.0 6.3* 7.0  ALBUMIN 4.0 3.6 3.9  AST 33 21 22  ALT 31 24 23   ALKPHOS 92 71 75  BILITOT 0.8 0.6 0.7   Iron/TIBC/Ferritin/ %Sat No results found for: IRON, TIBC, FERRITIN, IRONPCTSAT   Patient had labs done at  [place] [facility] on 05/09/2019.  Labs are reviewed.  RADIOGRAPHIC  STUDIES: I have personally reviewed the radiological images as listed and agreed with the findings in the report. MM DIAG BREAST TOMO BILATERAL  Result Date: 04/19/2020 CLINICAL DATA:  64 year old female presenting for routine annual surveillance status post right breast lumpectomy in July of 2020. EXAM: DIGITAL DIAGNOSTIC BILATERAL MAMMOGRAM WITH TOMO AND CAD COMPARISON:  Previous exam(s). ACR Breast Density Category b: There are scattered areas of fibroglandular density. FINDINGS: Expected surgical changes in the superior right breast consistent with history of lumpectomy. No suspicious calcifications, masses or areas of distortion are seen in the bilateral breasts. Mammographic images were processed with CAD. IMPRESSION: Expected surgical changes in the superior right breast consistent with history of lumpectomy. No evidence of malignancy in the bilateral breasts. RECOMMENDATION: Diagnostic mammogram is suggested in 1 year. (Code:DM-B-01Y) I have discussed the findings and recommendations with the patient. If applicable, a reminder letter will be sent to the patient regarding the next appointment. BI-RADS CATEGORY  2: Benign. Electronically Signed   By: Ammie Ferrier M.D.   On: 04/19/2020 14:51    ASSESSMENT & PLAN:  1. Ductal carcinoma in situ (DCIS) of right breast   2. Osteopenia, unspecified location   3. Family history of cancer   High-grade DCIS, ER positive, status post lumpectomy, and adjuvant radiation. Currently on adjuvant antiestrogen treatment with letrozole 2.5 mg.  Patient tolerates well with manageable symptoms. Continue current regimen.  Plan for total of 5 years. Annual diagnostic mammogram was independently reviewed by me and discussed with patient.  No mammographic signs of recurrence.  #Osteopenia, results were reviewed and discussed. Continue vitamin D and calcium supplementation. Exercise as tolerated .  Zometa 4 mg every 6 months.  Proceed with Zometa today Family  history of cancer, patient has family history of breast cancer, pancreatic cancer, stomach cancer. I will refer patient to genetic counselor   -Regarding to her subjective feeling of right axillary/upper extremity swelling, she has a history of sentinel lymph node biopsy and therefore she carries some risk of developing lymphedema.  I discussed with patient about referral to lymphedema clinic.  Patient prefers to watch her symptoms for now and if symptoms get worse she will update me. All questions were answered. The patient knows to call the clinic with any problems questions or concerns.  Return of visit: 6 months.  Earlie Server, MD, PhD Hematology Oncology [place] [facility] 04/23/2020

## 2020-04-23 NOTE — Progress Notes (Signed)
Patient here for follow up. No new breast problem or concerns.

## 2020-04-30 ENCOUNTER — Inpatient Hospital Stay (HOSPITAL_BASED_OUTPATIENT_CLINIC_OR_DEPARTMENT_OTHER): Payer: Medicare Other | Admitting: Licensed Clinical Social Worker

## 2020-04-30 ENCOUNTER — Inpatient Hospital Stay: Payer: Medicare Other

## 2020-04-30 ENCOUNTER — Other Ambulatory Visit: Payer: Self-pay

## 2020-04-30 ENCOUNTER — Encounter: Payer: Self-pay | Admitting: Licensed Clinical Social Worker

## 2020-04-30 DIAGNOSIS — Z801 Family history of malignant neoplasm of trachea, bronchus and lung: Secondary | ICD-10-CM | POA: Insufficient documentation

## 2020-04-30 DIAGNOSIS — Z800 Family history of malignant neoplasm of digestive organs: Secondary | ICD-10-CM

## 2020-04-30 DIAGNOSIS — D0511 Intraductal carcinoma in situ of right breast: Secondary | ICD-10-CM

## 2020-04-30 DIAGNOSIS — Z803 Family history of malignant neoplasm of breast: Secondary | ICD-10-CM

## 2020-04-30 NOTE — Progress Notes (Signed)
REFERRING PROVIDER: Earlie Server, MD [place],  [place] [address]  PRIMARY PROVIDER:  Hortencia Pilar, MD  PRIMARY REASON FOR VISIT:  1. Ductal carcinoma in situ (DCIS) of right breast   2. Family history of breast cancer   3. Family history of pancreatic cancer   4. Family history of stomach cancer   5. Family history of lung cancer   6. Family history of colon cancer      HISTORY OF PRESENT ILLNESS:   Jane Gardner, a 64 y.o. female, was seen for a [place] cancer genetics consultation at the request of Dr. Tasia Catchings due to a personal and family history of cancer.  Jane Gardner presents to clinic today to discuss the possibility of a hereditary predisposition to cancer, genetic testing, and to further clarify her future cancer risks, as well as potential cancer risks for family members.   In 2020, at the age of 57, Jane Gardner was diagnosed with DCIS of the right breast. This was treated with lumpectomy, radiation and letrozole which patient is still taking.   CANCER HISTORY:  Oncology History   No history exists.     RISK FACTORS:  Menarche was at age 70.  First live birth at age 66.  OCP use: yes Ovaries intact: yes.  Hysterectomy: no.  Menopausal status: postmenopausal.  HRT use: few years Colonoscopy: yes; reports polyps, unsure exact number but believes under 10.  Mammogram within the last year: yes. Number of breast biopsies: 1.   Past Medical History:  Diagnosis Date  . Asthma   . 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 Family history of breast cancer   . Family history of colon cancer   . Family history of lung cancer   . Family history of pancreatic cancer   . Family history of stomach cancer   . Fibrocystic breast   . GERD (gastroesophageal reflux disease)   . Hyperlipemia   . Hypertension   . Osteopenia 08/23/2019  . Personal history of radiation therapy   . Pneumonia     Past Surgical History:  Procedure Laterality Date  .  BREAST BIOPSY Right 05/02/2019   right breast stereo bx of calcs, coil clip, path pending  . BREAST CYST ASPIRATION     ? side neg  . MASTECTOMY, PARTIAL Right 05/24/2019   Procedure: MASTECTOMY PARTIAL, REEXCISION OF RIGHT BREAST;  Surgeon: Herbert Pun, MD;  Location: [facility] ORS;  Service: General;  Laterality: Right;  . PARTIAL MASTECTOMY WITH NEEDLE LOCALIZATION Right 05/15/2019   Procedure: PARTIAL MASTECTOMY WITH NEEDLE LOCALIZATION AND SENTINEL NODE, RIGHT;  Surgeon: Herbert Pun, MD;  Location: [facility] ORS;  Service: General;  Laterality: Right;  . TONSILLECTOMY    . TONSILLECTOMY AND ADENOIDECTOMY    . TUBAL LIGATION      Social History   Socioeconomic History  . Marital status: Married    Spouse name: Not on file  . Number of children: Not on file  . Years of education: Not on file  . Highest education level: Not on file  Occupational History  . Not on file  Tobacco Use  . Smoking status: Former Smoker    Packs/day: 1.00    Years: 3.00    Pack years: 3.00    Types: Cigarettes    Quit date: 1977    Years since quitting: 44.5  . Smokeless tobacco: Never Used  Vaping Use  . Vaping Use: Never used  Substance and Sexual Activity  .  Alcohol use: Yes    Comment: rare wine  . Drug use: No  . Sexual activity: Not on file  Other Topics Concern  . Not on file  Social History Narrative  . Not on file   Social Determinants of Health   Financial Resource Strain:   . Difficulty of Paying Living Expenses:   Food Insecurity:   . Worried About Charity fundraiser in the Last Year:   . Arboriculturist in the Last Year:   Transportation Needs:   . Film/video editor (Medical):   Marland Kitchen Lack of Transportation (Non-Medical):   Physical Activity:   . Days of Exercise per Week:   . Minutes of Exercise per Session:   Stress:   . Feeling of Stress :   Social Connections:   . Frequency of Communication with Friends and Family:   . Frequency of Social Gatherings  with Friends and Family:   . Attends Religious Services:   . Active Member of Clubs or Organizations:   . Attends Archivist Meetings:   Marland Kitchen Marital Status:      FAMILY HISTORY:  We obtained a detailed, 4-generation family history.  Significant diagnoses are listed below: Family History  Problem Relation Age of Onset  . Hypertension Mother   . Breast cancer Mother 73  . Hyperlipidemia Mother   . Bladder Cancer Mother 43  . Diabetes Father   . Pancreatic cancer Maternal Aunt        dx 62s  . Stomach cancer Maternal Uncle        dx 81s  . Diabetes Paternal Grandmother   . Lung cancer Maternal Uncle        both dx 44s  . Colon cancer Cousin        dx 30s-40s  . Breast cancer Cousin        dx 32s in Hospice   Jane Gardner has 2 sons, Corene Cornea, 42 and Justin 38 and 1 daughter, Learta Codding, 3. Patient has 1 sister, Sandy, 64 with no cancer history.   Jane Gardner mother was diagnosed with bladder cancer in her 38s. She was diagnosed with breast cancer at 64 and had a recurrence of this. She is living at 70. Patient had 5 maternal uncles and 2 maternal aunts. One aunt had pancreatic cancer in her 29s. Two uncles had lung cancer in their 79s. An uncle had stomach cancer in his 47s. One maternal cousin had colon cancer in his 57s-40s, and the daughter of one of patient's first cousins has breast cancer currently in her 73s and is in Hospice. Maternal grandmother died in her 34s, limited information about maternal grandfather.  Jane Gardner father died at 30, no cancer history. Patient had 2 paternal aunts and they died when they were girls, unknown cause. Paternal grandmother died in her 59s-70s and did have a brain tumor. Grandfather died at 33 due to gallbladder disease.   Jane Gardner is unaware of previous family history of genetic testing for hereditary cancer risks. Patient's maternal ancestors are of unknown descent, and paternal ancestors are of [place] descent. There is no reported  Ashkenazi Jewish ancestry. There is no known consanguinity.  GENETIC COUNSELING ASSESSMENT: Ms. Nash is a 64 y.o. female with a personal and family history of cancer which is somewhat suggestive of a hereditary cancer syndrome and predisposition to cancer. We, therefore, discussed and recommended the following at today's visit.   DISCUSSION: We discussed that 5-10% of cancer is hereditary, meaning  that it is due to a mutation in a single gene that is passed down from generation to generation in a family. Most cases of hereditary breast cancer are associated with BRCA1 and BRCA2  genes, although there are other genes associated with hereditary breast cancer as well. There are also genes, such as the Lynch syndrome genes, associated with other cancers we see in her family such as pancreatic, stomach and colon.  We discussed that testing is beneficial for several reasons including identifying potential screening and risk-reduction options that may be appropriate, and to understand if other family members could be at risk for cancer and allow them to undergo genetic testing.   We reviewed the characteristics, features and inheritance patterns of hereditary cancer syndromes. We also discussed genetic testing, including the appropriate family members to test, the process of testing, insurance coverage and turn-around-time for results. We discussed the implications of a negative, positive and/or variant of uncertain significant result. We recommended Ms. Wann pursue genetic testing for the [facility] Common Hereditary Cancers gene panel.   The Common Hereditary Cancers Panel offered by [facility] includes sequencing and/or deletion duplication testing of the following 48 genes: APC, ATM, AXIN2, BARD1, BMPR1A, BRCA1, BRCA2, BRIP1, CDH1, CDKN2A (p14ARF), CDKN2A (p16INK4a), CKD4, CHEK2, CTNNA1, DICER1, EPCAM (Deletion/duplication testing only), GREM1 (promoter region deletion/duplication testing only), KIT, MEN1, MLH1,  MSH2, MSH3, MSH6, MUTYH, NBN, NF1, NHTL1, PALB2, PDGFRA, PMS2, POLD1, POLE, PTEN, RAD50, RAD51C, RAD51D, RNF43, SDHB, SDHC, SDHD, SMAD4, SMARCA4. STK11, TP53, TSC1, TSC2, and VHL.  The following genes were evaluated for sequence changes only: SDHA and HOXB13 c.251G>A variant only.  Based on Ms. Waldeck's personal and family history of cancer, she meets medical criteria for genetic testing. Despite that she meets criteria, she may still have an out of pocket cost. We discussed that if her out of pocket cost for testing is over $100, the laboratory will call and confirm whether she wants to proceed with testing.  If the out of pocket cost of testing is less than $100 she will be billed by the genetic testing laboratory.   PLAN: After considering the risks, benefits, and limitations, Ms. Raulerson provided informed consent to pursue genetic testing and the blood sample was sent to [facility] for analysis of the Common Hereditary Cancers Panel. Results should be available within approximately 2-3 weeks' time, at which point they will be disclosed by telephone to Ms. Gouin, as will any additional recommendations warranted by these results. Ms. Ehresman will receive a summary of her genetic counseling visit and a copy of her results once available. This information will also be available in Epic.   Ms. Willhoite questions were answered to her satisfaction today. Our contact information was provided should additional questions or concerns arise. Thank you for the referral and allowing [place] to share in the care of your patient.   Faith Rogue, MS, [facility] Genetic Counselor [email or web address] Phone: [phone]  The patient was seen for a total of 40 minutes in face-to-face genetic counseling.  Dr. Grayland Ormond was available for discussion regarding this case.   _______________________________________________________________________ For Office Staff:  Number of people involved in session: 1 Was an  Intern/ student involved with case: no

## 2020-05-08 ENCOUNTER — Telehealth: Payer: Self-pay | Admitting: Licensed Clinical Social Worker

## 2020-05-08 ENCOUNTER — Encounter: Payer: Self-pay | Admitting: Licensed Clinical Social Worker

## 2020-05-08 ENCOUNTER — Ambulatory Visit: Payer: Self-pay | Admitting: Licensed Clinical Social Worker

## 2020-05-08 DIAGNOSIS — Z801 Family history of malignant neoplasm of trachea, bronchus and lung: Secondary | ICD-10-CM

## 2020-05-08 DIAGNOSIS — Z1379 Encounter for other screening for genetic and chromosomal anomalies: Secondary | ICD-10-CM | POA: Insufficient documentation

## 2020-05-08 DIAGNOSIS — Z803 Family history of malignant neoplasm of breast: Secondary | ICD-10-CM

## 2020-05-08 DIAGNOSIS — Z800 Family history of malignant neoplasm of digestive organs: Secondary | ICD-10-CM

## 2020-05-08 DIAGNOSIS — D0511 Intraductal carcinoma in situ of right breast: Secondary | ICD-10-CM

## 2020-05-08 NOTE — Progress Notes (Signed)
HPI:  [name] was previously seen in the [facility] clinic due to a personal and family history of cancer and concerns regarding a hereditary predisposition to cancer. Please refer to our prior cancer genetics clinic note for more information regarding our discussion, assessment and recommendations, at the time. [name] recent genetic test results were disclosed to her, as were recommendations warranted by these results. These results and recommendations are discussed in more detail below.  In 2020, at the age of 64, [name] was diagnosed with DCIS of the right breast. This was treated with lumpectomy, radiation and letrozole which patient is still taking.    CANCER HISTORY:  Oncology History  Ductal carcinoma in situ (DCIS) of right breast  06/08/2019 Initial Diagnosis   Ductal carcinoma in situ (DCIS) of right breast    Genetic Testing   Negative genetic testing. No pathogenic variants identified on the [facility] Common Hereditary Cancers Panel. The report date is 05/07/2020.   The Common Hereditary Cancers Panel offered by [facility] includes sequencing and/or deletion duplication testing of the following 48 genes: APC, ATM, AXIN2, BARD1, BMPR1A, BRCA1, BRCA2, BRIP1, CDH1, CDKN2A (p14ARF), CDKN2A (p16INK4a), CKD4, CHEK2, CTNNA1, DICER1, EPCAM (Deletion/duplication testing only), GREM1 (promoter region deletion/duplication testing only), KIT, MEN1, MLH1, MSH2, MSH3, MSH6, MUTYH, NBN, NF1, NHTL1, PALB2, PDGFRA, PMS2, POLD1, POLE, PTEN, RAD50, RAD51C, RAD51D, RNF43, SDHB, SDHC, SDHD, SMAD4, SMARCA4. STK11, TP53, TSC1, TSC2, and VHL.  The following genes were evaluated for sequence changes only: SDHA and HOXB13 c.251G>A variant only.     FAMILY HISTORY:  We obtained a detailed, 4-generation family history.  Significant diagnoses are listed below: Family History  Problem Relation Age of Onset  . Hypertension Mother   . Breast cancer Mother 3  . Hyperlipidemia Mother   .  Bladder Cancer Mother 77  . Diabetes Father   . Pancreatic cancer Maternal Aunt        dx 50s  . Stomach cancer Maternal Uncle        dx 72s  . Diabetes Paternal Grandmother   . Lung cancer Maternal Uncle        both dx 54s  . Colon cancer Cousin        dx 30s-40s  . Breast cancer Cousin        dx 71s in Hospice   [name] has 2 sons, Jane Gardner, 75 and Jane Gardner 38 and 1 daughter, Jane Gardner, 90. Patient has 1 sister, Jane Gardner, 64 with no cancer history.   [name] mother was diagnosed with bladder cancer in her 18s. She was diagnosed with breast cancer at 54 and had a recurrence of this. She is living at 5. Patient had 5 maternal uncles and 2 maternal aunts. One aunt had pancreatic cancer in her 106s. Two uncles had lung cancer in their 66s. An uncle had stomach cancer in his 55s. One maternal cousin had colon cancer in his 66s-40s, and the daughter of one of patient's first cousins has breast cancer currently in her 85s and is in Hospice. Maternal grandmother died in her 43s, limited information about maternal grandfather.  [name] father died at 42, no cancer history. Patient had 2 paternal aunts and they died when they were girls, unknown cause. Paternal grandmother died in her 42s-70s and did have a brain tumor. Grandfather died at 38 due to gallbladder disease.   [name] is unaware of previous family history of genetic testing for hereditary cancer risks. Patient's maternal ancestors are of unknown descent,  and paternal ancestors are of [place] descent. There is no reported Ashkenazi Jewish ancestry. There is no known consanguinity.   GENETIC TEST RESULTS: Genetic testing reported out on 05/07/2020 through the Common Hereditary cancer panel found no pathogenic mutations.   The Common Hereditary Cancers Panel offered by [facility] includes sequencing and/or deletion duplication testing of the following 48 genes: APC, ATM, AXIN2, BARD1, BMPR1A, BRCA1, BRCA2, BRIP1, CDH1, CDKN2A (p14ARF),  CDKN2A (p16INK4a), CKD4, CHEK2, CTNNA1, DICER1, EPCAM (Deletion/duplication testing only), GREM1 (promoter region deletion/duplication testing only), KIT, MEN1, MLH1, MSH2, MSH3, MSH6, MUTYH, NBN, NF1, NHTL1, PALB2, PDGFRA, PMS2, POLD1, POLE, PTEN, RAD50, RAD51C, RAD51D, RNF43, SDHB, SDHC, SDHD, SMAD4, SMARCA4. STK11, TP53, TSC1, TSC2, and VHL.  The following genes were evaluated for sequence changes only: SDHA and HOXB13 c.251G>A variant only/  The test report has been scanned into EPIC and is located under the Molecular Pathology section of the Results Review tab.  A portion of the result report is included below for reference.     We discussed with [name] that because current genetic testing is not perfect, it is possible there may be a gene mutation in one of these genes that current testing cannot detect, but that chance is small.  We also discussed, that there could be another gene that has not yet been discovered, or that we have not yet tested, that is responsible for the cancer diagnoses in the family. It is also possible there is a hereditary cause for the cancer in the family that [name] did not inherit and therefore was not identified in her testing.  Therefore, it is important to remain in touch with cancer genetics in the future so that we can continue to offer [name] the most up to date genetic testing.   ADDITIONAL GENETIC TESTING: We discussed with [name] that her genetic testing was fairly extensive.  If there are genes identified to increase cancer risk that can be analyzed in the future, we would be happy to discuss and coordinate this testing at that time.    CANCER SCREENING RECOMMENDATIONS: [name] test result is considered negative (normal).  This means that we have not identified a hereditary cause for her  personal and family history of cancer at this time. Most cancers happen by chance and this negative test suggests that her cancer may fall into this  category.    While reassuring, this does not definitively rule out a hereditary predisposition to cancer. It is still possible that there could be genetic mutations that are undetectable by current technology. There could be genetic mutations in genes that have not been tested or identified to increase cancer risk.  Therefore, it is recommended she continue to follow the cancer management and screening guidelines provided by her oncology and primary healthcare provider.   An individual's cancer risk and medical management are not determined by genetic test results alone. Overall cancer risk assessment incorporates additional factors, including personal medical history, family history, and any available genetic information that may result in a personalized plan for cancer prevention and surveillance.  RECOMMENDATIONS FOR FAMILY MEMBERS:  Relatives in this family might be at some increased risk of developing cancer, over the general population risk, simply due to the family history of cancer.  We recommended female relatives in this family have a yearly mammogram beginning at age 25, or 59 years younger than the earliest onset of cancer, an annual clinical breast exam, and perform monthly breast self-exams. Female relatives in this family  should also have a gynecological exam as recommended by their primary provider.  All family members should be referred for colonoscopy starting at age 26.    It is also possible there is a hereditary cause for the cancer in [name]'s family that she did not inherit and therefore was not identified in her.  Based on [name]'s family history, we recommended her maternal relatives, especially those who have had cancer, have genetic counseling and testing. [name] will let us know if we can be of any assistance in coordinating genetic counseling and/or testing for these family members.  FOLLOW-UP: Lastly, we discussed with [name] that cancer genetics is a rapidly  advancing field and it is possible that new genetic tests will be appropriate for her and/or her family members in the future. We encouraged her to remain in contact with cancer genetics on an annual basis so we can update her personal and family histories and let her know of advances in cancer genetics that may benefit this family.   Our contact number was provided. [name] questions were answered to her satisfaction, and she knows she is welcome to call us at anytime with additional questions or concerns.   Faith Rogue, MS, [facility] Genetic Counselor [email or web address] Phone: [phone]

## 2020-05-08 NOTE — Telephone Encounter (Signed)
Revealed negative genetic testing.  This normal result is reassuring and indicates that it is unlikely Jane Gardner's cancer is due to a hereditary cause.  It is unlikely that there is an increased risk of another cancer due to a mutation in one of these genes.  However, genetic testing is not perfect, and cannot definitively rule out a hereditary cause.  It will be important for her to keep in contact with genetics to learn if any additional testing may be needed in the future.

## 2020-07-28 ENCOUNTER — Emergency Department
Admission: EM | Admit: 2020-07-28 | Discharge: 2020-07-28 | Disposition: A | Discharge status: Home / Self Care | Payer: Medicare Other | Attending: Emergency Medicine | Admitting: Emergency Medicine

## 2020-07-28 ENCOUNTER — Other Ambulatory Visit: Payer: Self-pay

## 2020-07-28 ENCOUNTER — Emergency Department: Payer: Medicare Other

## 2020-07-28 DIAGNOSIS — Z91040 Latex allergy status: Secondary | ICD-10-CM | POA: Diagnosis not present

## 2020-07-28 DIAGNOSIS — J45909 Unspecified asthma, uncomplicated: Secondary | ICD-10-CM | POA: Insufficient documentation

## 2020-07-28 DIAGNOSIS — W230XXA Caught, crushed, jammed, or pinched between moving objects, initial encounter: Secondary | ICD-10-CM | POA: Insufficient documentation

## 2020-07-28 DIAGNOSIS — I10 Essential (primary) hypertension: Secondary | ICD-10-CM | POA: Insufficient documentation

## 2020-07-28 DIAGNOSIS — S61212A Laceration without foreign body of right middle finger without damage to nail, initial encounter: Secondary | ICD-10-CM | POA: Insufficient documentation

## 2020-07-28 DIAGNOSIS — Z87891 Personal history of nicotine dependence: Secondary | ICD-10-CM | POA: Diagnosis not present

## 2020-07-28 DIAGNOSIS — Y93E5 Activity, floor mopping and cleaning: Secondary | ICD-10-CM | POA: Diagnosis not present

## 2020-07-28 DIAGNOSIS — Z7951 Long term (current) use of inhaled steroids: Secondary | ICD-10-CM | POA: Diagnosis not present

## 2020-07-28 DIAGNOSIS — Y92009 Unspecified place in unspecified non-institutional (private) residence as the place of occurrence of the external cause: Secondary | ICD-10-CM | POA: Insufficient documentation

## 2020-07-28 DIAGNOSIS — S61213A Laceration without foreign body of left middle finger without damage to nail, initial encounter: Secondary | ICD-10-CM | POA: Diagnosis not present

## 2020-07-28 DIAGNOSIS — Z79899 Other long term (current) drug therapy: Secondary | ICD-10-CM | POA: Diagnosis not present

## 2020-07-28 DIAGNOSIS — S61218A Laceration without foreign body of other finger without damage to nail, initial encounter: Secondary | ICD-10-CM

## 2020-07-28 MED ORDER — LIDOCAINE HCL 1 % IJ SOLN
5.0000 mL | Freq: Once | INTRAMUSCULAR | Status: DC
  Filled 2020-07-28: qty 10

## 2020-07-28 MED ORDER — CEPHALEXIN 500 MG PO CAPS: 500.0000 mg | ORAL_CAPSULE | Freq: Three times a day (TID) | ORAL | 0 refills | Status: AC

## 2020-07-28 NOTE — Discharge Instructions (Signed)
Take Keflex three times daily for the next seven days.  Take sutures out in seven days.

## 2020-07-28 NOTE — ED Triage Notes (Signed)
FIRST NURSE NOTE: Pt arrived via POV states she was cleaning out an old house and the garage door pinched bilateral hands, pt has laceration noted to R & L middle finger, bleeding controlled at this time. Fingers wrapped with gauze on arrival.  Unknown when last tetanus shot was.  Pt states all fingers are painful, possibly broken.

## 2020-07-28 NOTE — ED Notes (Signed)
Pt with bilateral lacerations to bilateral middle fingers. Fingers elevated.

## 2020-07-28 NOTE — ED Triage Notes (Signed)
Pt has lacs to hands bilaterally.

## 2020-07-28 NOTE — ED Provider Notes (Signed)
Emergency Department Provider Note  ____________________________________________  Time seen: Approximately 3:50 PM  I have reviewed the triage vital signs and the nursing notes.   HISTORY  Chief Complaint Laceration   Historian Patient     HPI Jane Gardner is a 64 y.o. female presents to the emergency department with bilateral lacerations to the middle fingers.  Patient states that she pinched hands and garage door.  She cannot recall her last tetanus shot.  Denies numbness and tingling in all 10 fingers.  No similar injuries in the past.  No other alleviating measures have been attempted.   Past Medical History:  Diagnosis Date  . Asthma   . 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 Family history of breast cancer   . Family history of colon cancer   . Family history of lung cancer   . Family history of pancreatic cancer   . Family history of stomach cancer   . Fibrocystic breast   . GERD (gastroesophageal reflux disease)   . Hyperlipemia   . Hypertension   . Osteopenia 08/23/2019  . Personal history of radiation therapy   . Pneumonia      Immunizations up to date:  Yes.     Past Medical History:  Diagnosis Date  . Asthma   . 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 Family history of breast cancer   . Family history of colon cancer   . Family history of lung cancer   . Family history of pancreatic cancer   . Family history of stomach cancer   . Fibrocystic breast   . GERD (gastroesophageal reflux disease)   . Hyperlipemia   . Hypertension   . Osteopenia 08/23/2019  . Personal history of radiation therapy   . Pneumonia     Patient Active Problem List   Diagnosis Date Noted  . Genetic testing 05/08/2020  . Family history of breast cancer   . Family history of pancreatic cancer   . Family history of stomach cancer   . Family history of lung cancer   . Family history of colon cancer   . Osteopenia 08/21/2019   . 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 Hypercholesterolemia 03/15/2017  . Allergic conjunctivitis of both eyes 01/13/2016  . Chronic cough 01/13/2016  . Seasonal allergic rhinitis 01/13/2016  . Fatty liver 04/05/2015  . Benign essential hypertension 03/05/2015  . GERD (gastroesophageal reflux disease) 06/01/2011  . Hyperglycemia 06/01/2011  . Migraine without aura and without status migrainosus, not intractable 06/01/2011    Past Surgical History:  Procedure Laterality Date  . BREAST BIOPSY Right 05/02/2019   right breast stereo bx of calcs, coil clip, path pending  . BREAST CYST ASPIRATION     ? side neg  . MASTECTOMY, PARTIAL Right 05/24/2019   Procedure: MASTECTOMY PARTIAL, REEXCISION OF RIGHT BREAST;  Surgeon: Herbert Pun, MD;  Location: [facility] ORS;  Service: General;  Laterality: Right;  . PARTIAL MASTECTOMY WITH NEEDLE LOCALIZATION Right 05/15/2019   Procedure: PARTIAL MASTECTOMY WITH NEEDLE LOCALIZATION AND SENTINEL NODE, RIGHT;  Surgeon: Herbert Pun, MD;  Location: [facility] ORS;  Service: General;  Laterality: Right;  . TONSILLECTOMY    . TONSILLECTOMY AND ADENOIDECTOMY    . TUBAL LIGATION      Prior to Admission medications   Medication Sig Start Date End Date Taking? Authorizing Provider  albuterol (PROVENTIL HFA;VENTOLIN HFA) 108 (90 Base) MCG/ACT inhaler Inhale 1-2 puffs into the lungs every  6 (six) hours as needed for wheezing or shortness of breath. 11/18/15   Jan Fireman, PA-C  Ascorbic Acid (VITAMIN C) 1000 MG tablet Take 1 tablet by mouth daily.    [provider]  atorvastatin (LIPITOR) 40 MG tablet Take 40 mg by mouth daily. 05/01/19   [provider]  Biotin 5000 MCG CAPS Take by mouth.    [provider]  cephALEXin (KEFLEX) 500 MG capsule Take 1 capsule (500 mg total) by mouth 3 (three) times daily for 7 days. 07/28/20 08/04/20  Lannie Fields, PA-C  cetirizine (ZYRTEC) 10 MG tablet Take 10 mg by  mouth daily.    [provider]  Cholecalciferol (VITAMIN D3) 25 MCG (1000 UT) CAPS Take by mouth.    [provider]  co-enzyme Q-10 50 MG capsule Take 100 mg by mouth daily.    [provider]  ELDERBERRY PO Take by mouth.    [provider]  famotidine (PEPCID) 20 MG tablet Take by mouth. 10/05/18   [provider]  fluticasone (FLONASE) 50 MCG/ACT nasal spray Place 2 sprays into both nostrils daily. 11/18/15   Jan Fireman, PA-C  KRILL OIL PO Take 1 capsule by mouth daily.    [provider]  letrozole (FEMARA) 2.5 MG tablet Take 1 tablet (2.5 mg total) by mouth daily. 12/20/19   Earlie Server, MD  lisinopril (ZESTRIL) 10 MG tablet Take 10 mg by mouth daily. 03/30/19   [provider]  Misc Natural Products (GLUCOSAMINE CHONDROITIN ADV PO) Take by mouth.    [provider]    Allergies Hyoscyamine sulfate, Latex, and Other  Family History  Problem Relation Age of Onset  . Hypertension Mother   . Breast cancer Mother 32  . Hyperlipidemia Mother   . Bladder Cancer Mother 62  . Diabetes Father   . Pancreatic cancer Maternal Aunt        dx 25s  . Stomach cancer Maternal Uncle        dx 58s  . Diabetes Paternal Grandmother   . Lung cancer Maternal Uncle        both dx 54s  . Colon cancer Cousin        dx 30s-40s  . Breast cancer Cousin        dx 68s in Hospice    Social History Social History   Tobacco Use  . Smoking status: Former Smoker    Packs/day: 1.00    Years: 3.00    Pack years: 3.00    Types: Cigarettes    Quit date: 1977    Years since quitting: 44.7  . Smokeless tobacco: Never Used  Vaping Use  . Vaping Use: Never used  Substance Use Topics  . Alcohol use: Yes    Comment: rare wine  . Drug use: No     Review of Systems  Constitutional: No fever/chills Eyes:  No discharge ENT: No upper respiratory complaints. Respiratory: no cough. No SOB/ use of accessory muscles to  breath Gastrointestinal:   No nausea, no vomiting.  No diarrhea.  No constipation. Musculoskeletal: Patient has hand pain.  Skin: Patient has lacerations.     ____________________________________________   PHYSICAL EXAM:  VITAL SIGNS: ED Triage Vitals  Enc Vitals Group     BP 07/28/20 1414 (!) 154/105     Pulse Rate 07/28/20 1414 87     Resp 07/28/20 1414 18     Temp 07/28/20 1414 98 F (36.7 C)  Temp Source 07/28/20 1414 Oral     SpO2 07/28/20 1414 98 %     Weight 07/28/20 1415 220 lb (99.8 kg)     Height 07/28/20 1415 5' 3.5" (1.613 m)     Head Circumference --      Peak Flow --      Pain Score 07/28/20 1415 6     Pain Loc --      Pain Edu? --      Excl. in [place]? --      Constitutional: Alert and oriented. Well appearing and in no acute distress. Eyes: Conjunctivae are normal. PERRL. EOMI. Head: Atraumatic. Cardiovascular: Normal rate, regular rhythm. Normal S1 and S2.  Good peripheral circulation. Respiratory: Normal respiratory effort without tachypnea or retractions. Lungs CTAB. Good air entry to the bases with no decreased or absent breath sounds Gastrointestinal: Bowel sounds x 4 quadrants. Soft and nontender to palpation. No guarding or rigidity. No distention. Musculoskeletal: Full range of motion to all extremities. No obvious deformities noted.  No flexor or extensor tendon deficits appreciated with testing.  Palpable radial pulse bilaterally and symmetrically. Neurologic:  Normal for age. No gross focal neurologic deficits are appreciated.  Skin: Patient has a 2 cm laceration along the volar aspect of right middle finger.  Patient has a 1 cm superficial laceration along the volar aspect of the left middle finger. Psychiatric: Mood and affect are normal for age. Speech and behavior are normal.   ____________________________________________   LABS (all labs ordered are listed, but only abnormal results are displayed)  Labs Reviewed - No data to  display ____________________________________________  EKG   ____________________________________________  RADIOLOGY Unk Pinto, personally viewed and evaluated these images (plain radiographs) as part of my medical decision making, as well as reviewing the written report by the radiologist.  DG Finger Middle Left  Result Date: 07/28/2020 CLINICAL DATA:  Right and left middle finger laceration after garage door fell. EXAM: LEFT MIDDLE FINGER 2+V COMPARISON:  None. FINDINGS: There is no evidence of fracture or dislocation. There is no evidence of arthropathy or other focal bone abnormality. Mild soft tissue edema. No radiopaque foreign body. IMPRESSION: No fracture or subluxation of the left middle finger. Mild soft tissue edema. Electronically Signed   By: Keith Rake M.D.   On: 07/28/2020 16:54   DG Finger Middle Right  Result Date: 07/28/2020 CLINICAL DATA:  Right and left middle finger laceration after garage door fell. EXAM: RIGHT MIDDLE FINGER 2+V COMPARISON:  None. FINDINGS: There is no evidence of fracture or dislocation. 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 Generalized soft tissue edema. No radiopaque foreign body. IMPRESSION: Generalized soft tissue edema. No acute fracture or dislocation. Electronically Signed   By: Keith Rake M.D.   On: 07/28/2020 16:53    ____________________________________________    PROCEDURES  Procedure(s) performed:     Marland KitchenMarland KitchenLaceration Repair  Date/Time: 07/28/2020 3:52 PM Performed by: Lannie Fields, PA-C Authorized by: Lannie Fields, PA-C   Consent:    Consent obtained:  Verbal   Consent given by:  Patient   Risks discussed:  Infection, pain, retained foreign body, poor cosmetic result and poor wound healing Anesthesia (see MAR for exact dosages):    Anesthesia method:  Local infiltration   Local anesthetic:  Lidocaine 1% w/o epi Laceration details:    Location:  Finger   Finger location:  R long  finger   Length (cm):  2   Depth (mm):  5 Repair type:  Repair type:  Simple Exploration:    Hemostasis achieved with:  Direct pressure   Wound exploration: entire depth of wound probed and visualized     Contaminated: no   Treatment:    Area cleansed with:  Saline and Betadine   Amount of cleaning:  Extensive   Irrigation solution:  Sterile saline   Irrigation volume:  500   Visualized foreign bodies/material removed: no   Skin repair:    Repair method:  Sutures   Suture size:  4-0   Suture technique:  Running   Number of sutures:  5 Approximation:    Approximation:  Close Post-procedure details:    Dressing:  Sterile dressing   Patient tolerance of procedure:  Tolerated well, no immediate complications .Marland KitchenLaceration Repair  Date/Time: 07/28/2020 4:29 PM Performed by: Lannie Fields, PA-C Authorized by: Lannie Fields, PA-C   Consent:    Consent obtained:  Verbal   Consent given by:  Patient   Risks discussed:  Infection, pain, retained foreign body, poor cosmetic result and poor wound healing Anesthesia (see MAR for exact dosages):    Anesthesia method:  Local infiltration   Local anesthetic:  Lidocaine 1% w/o epi Laceration details:    Location:  Finger   Finger location:  L long finger   Length (cm):  1   Depth (mm):  3 Repair type:    Repair type:  Simple Exploration:    Hemostasis achieved with:  Direct pressure   Wound exploration: entire depth of wound probed and visualized     Contaminated: no   Treatment:    Area cleansed with:  Saline and Betadine   Amount of cleaning:  Extensive   Irrigation solution:  Sterile saline   Irrigation volume:  250   Visualized foreign bodies/material removed: no   Skin repair:    Repair method:  Sutures   Suture size:  4-0   Suture material:  Nylon   Suture technique:  Running locked   Number of sutures:  4 Approximation:    Approximation:  Close Post-procedure details:    Dressing:  Sterile dressing   Patient  tolerance of procedure:  Tolerated well, no immediate complications       Medications  lidocaine (XYLOCAINE) 1 % (with pres) injection 5 mL (has no administration in time range)     ____________________________________________   INITIAL IMPRESSION / ASSESSMENT AND PLAN / ED COURSE  Pertinent labs & imaging results that were available during my care of the patient were reviewed by me and considered in my medical decision making (see chart for details).      Assessment and Plan: Laceration:  64 year old female presents to the emergency department with 2 lacerations along the volar aspect of the right and left long fingers.  Lacerations were repaired in the emergency department without complication.  No bony abnormalities were visualized on x-rays of the right and left long fingers.  Patient was advised to have external sutures removed in 7 days.  Patient was started on Keflex to be taken 3 times daily for the next 7 days.  Return precautions were given to return with new or worsening symptoms.  ____________________________________________  FINAL CLINICAL IMPRESSION(S) / ED DIAGNOSES  Final diagnoses:  Laceration of middle finger without foreign body without damage to nail, unspecified laterality, initial encounter      NEW MEDICATIONS STARTED DURING THIS VISIT:  ED Discharge Orders         Ordered    cephALEXin (KEFLEX) 500 MG  capsule  3 times daily        07/28/20 1710              This chart was dictated using voice recognition software/Dragon. Despite best efforts to proofread, errors can occur which can change the meaning. Any change was purely unintentional.     Lannie Fields, PA-C 07/28/20 1745    Duffy Bruce, MD 08/02/20 [phone]

## 2020-08-04 ENCOUNTER — Other Ambulatory Visit: Payer: Self-pay

## 2020-08-04 ENCOUNTER — Ambulatory Visit
Admission: EM | Admit: 2020-08-04 | Discharge: 2020-08-04 | Disposition: A | Discharge status: Home / Self Care | Payer: Medicare Other | Attending: Internal Medicine | Admitting: Internal Medicine

## 2020-08-04 DIAGNOSIS — Z4802 Encounter for removal of sutures: Secondary | ICD-10-CM | POA: Diagnosis not present

## 2020-08-04 NOTE — ED Notes (Signed)
Running suture removed from both right and left middle fingers. Edges well approximated with no sx infection

## 2020-08-04 NOTE — ED Triage Notes (Signed)
Pt here for suture removal to left hand middle finger and right hand middle finger. No sx infection

## 2020-08-04 NOTE — ED Provider Notes (Signed)
[facility]    CSN: [id number] Arrival date & time: 08/04/20  1219      History   Chief Complaint Chief Complaint  Patient presents with  . Suture / Staple Removal    HPI Jane Gardner is a 64 y.o. female who presents today for suture removal.  The patient had sutures placed 1 week ago at the emergency room.  The sutures were in a running suture applied to the distal aspect of the left middle finger and right middle finger.  She was prescribed antibiotics which she still has 1 dose remaining.  She denies any significant pain at today's visit.  Denies any issues with her hands.  She does state that the laceration was deeper of the right middle finger when compared to the left middle finger.  HPI  Past Medical History:  Diagnosis Date  . Asthma   . 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 Family history of breast cancer   . Family history of colon cancer   . Family history of lung cancer   . Family history of pancreatic cancer   . Family history of stomach cancer   . Fibrocystic breast   . GERD (gastroesophageal reflux disease)   . Hyperlipemia   . Hypertension   . Osteopenia 08/23/2019  . Personal history of radiation therapy   . Pneumonia     Patient Active Problem List   Diagnosis Date Noted  . Genetic testing 05/08/2020  . Family history of breast cancer   . Family history of pancreatic cancer   . Family history of stomach cancer   . Family history of lung cancer   . Family history of colon cancer   . Osteopenia 08/21/2019  . 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 Hypercholesterolemia 03/15/2017  . Allergic conjunctivitis of both eyes 01/13/2016  . Chronic cough 01/13/2016  . Seasonal allergic rhinitis 01/13/2016  . Fatty liver 04/05/2015  . Benign essential hypertension 03/05/2015  . GERD (gastroesophageal reflux disease) 06/01/2011  . Hyperglycemia 06/01/2011  . Migraine without aura and without status  migrainosus, not intractable 06/01/2011    Past Surgical History:  Procedure Laterality Date  . BREAST BIOPSY Right 05/02/2019   right breast stereo bx of calcs, coil clip, path pending  . BREAST CYST ASPIRATION     ? side neg  . MASTECTOMY, PARTIAL Right 05/24/2019   Procedure: MASTECTOMY PARTIAL, REEXCISION OF RIGHT BREAST;  Surgeon: Herbert Pun, MD;  Location: [facility] ORS;  Service: General;  Laterality: Right;  . PARTIAL MASTECTOMY WITH NEEDLE LOCALIZATION Right 05/15/2019   Procedure: PARTIAL MASTECTOMY WITH NEEDLE LOCALIZATION AND SENTINEL NODE, RIGHT;  Surgeon: Herbert Pun, MD;  Location: [facility] ORS;  Service: General;  Laterality: Right;  . TONSILLECTOMY    . TONSILLECTOMY AND ADENOIDECTOMY    . TUBAL LIGATION      OB History   No obstetric history on file.      Home Medications    Prior to Admission medications   Medication Sig Start Date End Date Taking? Authorizing Provider  albuterol (PROVENTIL HFA;VENTOLIN HFA) 108 (90 Base) MCG/ACT inhaler Inhale 1-2 puffs into the lungs every 6 (six) hours as needed for wheezing or shortness of breath. 11/18/15   Jan Fireman, PA-C  Ascorbic Acid (VITAMIN C) 1000 MG tablet Take 1 tablet by mouth daily.    [provider]  atorvastatin (LIPITOR) 40 MG tablet Take 40 mg by mouth daily. 05/01/19   [provider]  Biotin 5000 MCG CAPS Take by mouth.    [provider]  cephALEXin (KEFLEX) 500 MG capsule Take 1 capsule (500 mg total) by mouth 3 (three) times daily for 7 days. 07/28/20 08/04/20  Lannie Fields, PA-C  cetirizine (ZYRTEC) 10 MG tablet Take 10 mg by mouth daily.    [provider]  Cholecalciferol (VITAMIN D3) 25 MCG (1000 UT) CAPS Take by mouth.    [provider]  co-enzyme Q-10 50 MG capsule Take 100 mg by mouth daily.    [provider]  ELDERBERRY PO Take by mouth.    [provider]  famotidine (PEPCID) 20 MG tablet Take by mouth. 10/05/18    [provider]  fluticasone (FLONASE) 50 MCG/ACT nasal spray Place 2 sprays into both nostrils daily. 11/18/15   Jan Fireman, PA-C  KRILL OIL PO Take 1 capsule by mouth daily.    [provider]  letrozole (FEMARA) 2.5 MG tablet Take 1 tablet (2.5 mg total) by mouth daily. 12/20/19   Earlie Server, MD  lisinopril (ZESTRIL) 10 MG tablet Take 10 mg by mouth daily. 03/30/19   [provider]  Misc Natural Products (GLUCOSAMINE CHONDROITIN ADV PO) Take by mouth.    [provider]    Family History Family History  Problem Relation Age of Onset  . Hypertension Mother   . Breast cancer Mother 32  . Hyperlipidemia Mother   . Bladder Cancer Mother 23  . Diabetes Father   . Pancreatic cancer Maternal Aunt        dx 17s  . Stomach cancer Maternal Uncle        dx 61s  . Diabetes Paternal Grandmother   . Lung cancer Maternal Uncle        both dx 75s  . Colon cancer Cousin        dx 30s-40s  . Breast cancer Cousin        dx 44s in Hospice    Social History Social History   Tobacco Use  . Smoking status: Former Smoker    Packs/day: 1.00    Years: 3.00    Pack years: 3.00    Types: Cigarettes    Quit date: 1977    Years since quitting: 44.7  . Smokeless tobacco: Never Used  Vaping Use  . Vaping Use: Never used  Substance Use Topics  . Alcohol use: Yes    Comment: rare wine  . Drug use: No    Allergies   Hyoscyamine sulfate, Latex, and Other   Review of Systems Review of Systems  Constitutional: Negative.   HENT: Negative.   Eyes: Negative.   Respiratory: Negative.   Cardiovascular: Negative.   Gastrointestinal: Negative.   Endocrine: Negative.   Genitourinary: Negative.   Musculoskeletal: Positive for joint swelling.  Skin: Negative.   Allergic/Immunologic: Negative.   Neurological: Negative.   Hematological: Negative.   Psychiatric/Behavioral: Negative.   All other systems reviewed and are negative.   Physical Exam Triage Vital  Signs ED Triage Vitals [08/04/20 1237]  Enc Vitals Group     BP (!) 142/85     Pulse Rate 63     Resp 16     Temp 98.8 F (37.1 C)     Temp Source Oral     SpO2 99 %     Weight      Height      Head Circumference      Peak Flow  Pain Score 0     Pain Loc      Pain Edu?      Excl. in [place]?    No data found.  Updated Vital Signs BP (!) 142/85 (BP Location: Left Arm)   Pulse 63   Temp 98.8 F (37.1 C) (Oral)   Resp 16   SpO2 99%   Visual Acuity Right Eye Distance:   Left Eye Distance:   Bilateral Distance:    Right Eye Near:   Left Eye Near:    Bilateral Near:     Physical Exam Skin examination of bilateral hands reveals linear lacerations just distal to the DIP joint.  Sutures were removed without complications.  No evidence of infection such as erythema or purulent drainage.  With testing of flexion and extension of bilateral middle fingers, the FDS and FDP are intact, no significant pain with flexion activities.  Cap refill is intact.  Patient is intact light touch to bilateral upper extremities.  UC Treatments / Results  Labs (all labs ordered are listed, but only abnormal results are displayed) Labs Reviewed - No data to display  EKG   Radiology No results found.  Procedures Procedures (including critical care time)  Medications Ordered in UC Medications - No data to display  Initial Impression / Assessment and Plan / UC Course  I have reviewed the triage vital signs and the nursing notes.  Pertinent labs & imaging results that were available during my care of the patient were reviewed by me and considered in my medical decision making (see chart for details).     1.  Treatment options were discussed today with the patient. 2.  Suture removal was performed today.  Examination bilateral hands reveals no injury to the FDS or FDP at this time. 3.  Return back to full activities for left hand.  She was encouraged to keep the right middle finger  covered for additional 72 hours.  Gradual return back to normal activities of the right hand over the weekend. Final Clinical Impressions(s) / UC Diagnoses   Final diagnoses:  Visit for suture removal     Discharge Instructions     For the left hand you don't need to keep it covered, return back to normal activities. Keep the right finger covered and dry for a additional 3 days. Limit activities to the right hand until next week and then you may begin to use the hand for all activities as tolerated.   ED Prescriptions    None     PDMP not reviewed this encounter.   Lattie Corns, [place] 08/04/20 [phone]

## 2020-08-04 NOTE — Discharge Instructions (Addendum)
For the left hand you don't need to keep it covered, return back to normal activities. Keep the right finger covered and dry for a additional 3 days. Limit activities to the right hand until next week and then you may begin to use the hand for all activities as tolerated.

## 2020-08-15 ENCOUNTER — Ambulatory Visit
Admission: RE | Admit: 2020-08-15 | Discharge: 2020-08-15 | Disposition: A | Discharge status: Home / Self Care | Payer: Medicare Other | Source: Ambulatory Visit | Attending: Radiation Oncology | Admitting: Radiation Oncology

## 2020-08-15 ENCOUNTER — Encounter: Payer: Self-pay | Admitting: Radiation Oncology

## 2020-08-15 ENCOUNTER — Other Ambulatory Visit: Payer: Self-pay

## 2020-08-15 VITALS — BP 125/87 | HR 77 | Temp 97.2°F | Resp 16 | Wt 226.8 lb

## 2020-08-15 DIAGNOSIS — Z170 Estrogen receptor positive status [ER+]: Secondary | ICD-10-CM | POA: Insufficient documentation

## 2020-08-15 DIAGNOSIS — Z923 Personal history of irradiation: Secondary | ICD-10-CM | POA: Insufficient documentation

## 2020-08-15 DIAGNOSIS — D0511 Intraductal carcinoma in situ of right breast: Secondary | ICD-10-CM | POA: Insufficient documentation

## 2020-08-15 DIAGNOSIS — Z79811 Long term (current) use of aromatase inhibitors: Secondary | ICD-10-CM | POA: Insufficient documentation

## 2020-08-15 NOTE — Progress Notes (Signed)
Radiation Oncology Follow up Note  Name: Jane Gardner   Date:   08/15/2020 MRN:  [id number] DOB: 01-27-1956    This 64 y.o. female presents to the clinic today for 1 year follow-up status post whole breast radiation to right breast for ER/PR positive ductal carcinoma in situ.  REFERRING PROVIDER: Hortencia Pilar, MD  HPI: Patient is a 64 year old female now out 1 year having completed whole breast radiation for an ER/PR positive ductal carcinoma in situ seen today in routine follow-up she is doing well specifically denies breast tenderness cough or bone pain.  She is currently on letrozole tolerating it well without side effect.  She had mammograms back in June which I have reviewed were BI-RADS 2 benign.  COMPLICATIONS OF TREATMENT: none  FOLLOW UP COMPLIANCE: keeps appointments   PHYSICAL EXAM:  BP 125/87 (BP Location: Left Arm, Patient Position: Sitting)   Pulse 77   Temp (!) 97.2 F (36.2 C) (Tympanic)   Resp 16   Wt 226 lb 12.8 oz (102.9 kg)   BMI 39.55 kg/m  Lungs are clear to A&P cardiac examination essentially unremarkable with regular rate and rhythm. No dominant mass or nodularity is noted in either breast in 2 positions examined. Incision is well-healed. No axillary or supraclavicular adenopathy is appreciated. Cosmetic result is excellent.  Some slight scar tissue in her lumpectomy site which is normal.  Well-developed well-nourished patient in NAD. HEENT reveals PERLA, EOMI, discs not visualized.  Oral cavity is clear. No oral mucosal lesions are identified. Neck is clear without evidence of cervical or supraclavicular adenopathy. Lungs are clear to A&P. Cardiac examination is essentially unremarkable with regular rate and rhythm without murmur rub or thrill. Abdomen is benign with no organomegaly or masses noted. Motor sensory and DTR levels are equal and symmetric in the upper and lower extremities. Cranial nerves II through XII are grossly intact. Proprioception is intact.  No peripheral adenopathy or edema is identified. No motor or sensory levels are noted. Crude visual fields are within normal range.  RADIOLOGY RESULTS: Mammograms reviewed compatible with above-stated findings  PLAN: Present time patient is doing well with no evidence of disease.  I am pleased with her overall progress.  She continues on letrozole without side effect.  I have asked to see her back in a whole year for follow-up.  Patient knows to call with any concerns at any time.  I would like to take this opportunity to thank you for allowing me to participate in the care of your patient.Noreene Filbert, MD

## 2020-10-22 ENCOUNTER — Ambulatory Visit: Payer: Medicare Other | Admitting: Oncology

## 2020-10-22 ENCOUNTER — Ambulatory Visit: Payer: Medicare Other

## 2020-10-22 ENCOUNTER — Other Ambulatory Visit: Payer: Medicare Other

## 2020-10-28 ENCOUNTER — Telehealth: Payer: Self-pay | Admitting: *Deleted

## 2020-10-28 NOTE — Telephone Encounter (Signed)
Call returned to patient and advised of doctor response, She states she has an appointment tomorrow at [facility] and will let us know the results

## 2020-10-28 NOTE — Telephone Encounter (Signed)
Steward Drone, This is a Dr. Cathie Hoops patient.

## 2020-10-28 NOTE — Telephone Encounter (Signed)
Patient called reporting that her husband tested positive for COVID on the 25th and that she has an appointment for doctor and infusion on the 6th and is asking if that needs to be rescheduled

## 2020-10-28 NOTE — Telephone Encounter (Signed)
Recommend pt to be tested and communicate with her pcp if any symptoms, positive results. If she is negative, no need to delay her appt/infusion.

## 2020-10-31 ENCOUNTER — Telehealth: Payer: Self-pay | Admitting: *Deleted

## 2020-10-31 NOTE — Telephone Encounter (Signed)
Patient called reporting that she has had 4 negative COVID tests and that she and her husband had the PCR test done and has come back negative on both of them. She states she will be at her appointment as scheduled per discussion with someone on Dr Bethanne Ginger team.

## 2020-11-06 ENCOUNTER — Inpatient Hospital Stay: Payer: Medicare HMO

## 2020-11-06 ENCOUNTER — Other Ambulatory Visit: Payer: Self-pay

## 2020-11-06 ENCOUNTER — Encounter: Payer: Self-pay | Admitting: Oncology

## 2020-11-06 ENCOUNTER — Inpatient Hospital Stay: Payer: Medicare HMO | Admitting: Oncology

## 2020-11-06 ENCOUNTER — Inpatient Hospital Stay: Payer: Medicare HMO | Attending: Oncology

## 2020-11-06 VITALS — BP 142/86 | HR 71 | Temp 98.7°F | Resp 18 | Wt 230.2 lb

## 2020-11-06 DIAGNOSIS — Z170 Estrogen receptor positive status [ER+]: Secondary | ICD-10-CM | POA: Insufficient documentation

## 2020-11-06 DIAGNOSIS — Z800 Family history of malignant neoplasm of digestive organs: Secondary | ICD-10-CM | POA: Diagnosis not present

## 2020-11-06 DIAGNOSIS — Z803 Family history of malignant neoplasm of breast: Secondary | ICD-10-CM | POA: Diagnosis not present

## 2020-11-06 DIAGNOSIS — D0511 Intraductal carcinoma in situ of right breast: Secondary | ICD-10-CM | POA: Insufficient documentation

## 2020-11-06 DIAGNOSIS — M8580 Other specified disorders of bone density and structure, unspecified site: Secondary | ICD-10-CM

## 2020-11-06 DIAGNOSIS — Z79811 Long term (current) use of aromatase inhibitors: Secondary | ICD-10-CM

## 2020-11-06 LAB — CBC WITH DIFFERENTIAL/PLATELET
Abs Immature Granulocytes: 0.01 10*3/uL (ref 0.00–0.07)
Basophils Absolute: 0 10*3/uL (ref 0.0–0.1)
Basophils Relative: 1 %
Eosinophils Absolute: 0.2 10*3/uL (ref 0.0–0.5)
Eosinophils Relative: 4 %
HCT: 40.2 % (ref 36.0–46.0)
Hemoglobin: 13.8 g/dL (ref 12.0–15.0)
Immature Granulocytes: 0 %
Lymphocytes Relative: 26 %
Lymphs Abs: 1.5 10*3/uL (ref 0.7–4.0)
MCH: 31.5 pg (ref 26.0–34.0)
MCHC: 34.3 g/dL (ref 30.0–36.0)
MCV: 91.8 fL (ref 80.0–100.0)
Monocytes Absolute: 0.6 10*3/uL (ref 0.1–1.0)
Monocytes Relative: 10 %
Neutro Abs: 3.6 10*3/uL (ref 1.7–7.7)
Neutrophils Relative %: 59 %
Platelets: 259 10*3/uL (ref 150–400)
RBC: 4.38 MIL/uL (ref 3.87–5.11)
RDW: 12.6 % (ref 11.5–15.5)
WBC: 6 10*3/uL (ref 4.0–10.5)
nRBC: 0 % (ref 0.0–0.2)

## 2020-11-06 LAB — COMPREHENSIVE METABOLIC PANEL
ALT: 28 U/L (ref 0–44)
AST: 24 U/L (ref 15–41)
Albumin: 3.8 g/dL (ref 3.5–5.0)
Alkaline Phosphatase: 71 U/L (ref 38–126)
Anion gap: 10 (ref 5–15)
BUN: 18 mg/dL (ref 8–23)
CO2: 27 mmol/L (ref 22–32)
Calcium: 9.5 mg/dL (ref 8.9–10.3)
Chloride: 102 mmol/L (ref 98–111)
Creatinine, Ser: 0.93 mg/dL (ref 0.44–1.00)
GFR, Estimated: >60 mL/min (ref >60)
Glucose, Bld: 111 mg/dL — ABNORMAL HIGH (ref 70–99)
Potassium: 4 mmol/L (ref 3.5–5.1)
Sodium: 139 mmol/L (ref 135–145)
Total Bilirubin: 0.9 mg/dL (ref 0.3–1.2)
Total Protein: 6.7 g/dL (ref 6.5–8.1)

## 2020-11-06 MED ORDER — ZOLEDRONIC ACID 4 MG/100ML IV SOLN
4.0000 mg | Freq: Once | INTRAVENOUS | Status: AC
  Administered 2020-11-06: 4 mg via INTRAVENOUS
  Filled 2020-11-06: qty 100

## 2020-11-06 MED ORDER — SODIUM CHLORIDE 0.9 % IV SOLN
Freq: Once | INTRAVENOUS | Status: AC
  Filled 2020-11-06: qty 250

## 2020-11-06 NOTE — Progress Notes (Signed)
Zometa well tolerated. Discharged home in stable condition.

## 2020-11-06 NOTE — Progress Notes (Signed)
Pt here for follow up. No new concerns voiced. No new concerns voiced.

## 2020-11-06 NOTE — Progress Notes (Signed)
Hematology/Oncology follow up  note [facility] [phone] [phone]   Patient Care Team: Hortencia Pilar, MD as PCP - General (Family Medicine) Noreene Filbert, MD as Radiation Oncologist (Radiation Oncology)  REFERRING PROVIDER: Hortencia Pilar, MD  CHIEF COMPLAINTS/REASON FOR VISIT:  Follow up of DCIS  HISTORY OF PRESENTING ILLNESS:  Jane Gardner is a  65 y.o.  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  Estrogen receptor status, >90%  Nipple discharge: Denies Family history: Mother had history of breast cancer and bladder cancer.  Alive.  Maternal aunt has pancreatic cancer.  Maternal uncle has stomach cancer.  Maternal uncle has lung cancer. OCP use: Previous use of OCP Estrogen and progesterone therapy: Menopause in 12s, she was on hormone replacement for a few years. History of radiation to chest: denies.  No previous breast biopsy.  # 05/15/2019 patient underwent right lumpectomy with sentinel lymph node biopsy. Pathology showed high-grade DCIS, 1mm, grade 3, posterior margin was positive for DCIS.  Regional lymph nodes uninvolved by tumor cells. pTis N0 05/24/2019 reexcision negative for DCIS. 08/09/2019 finished adjuvant radiation 08/23/2019 started on letrozole 2.5 mg INTERVAL HISTORY Jane Gardner is a 65 y.o. female who has above history reviewed by me today presents for follow up visit for management of DCIS Problems and complaints are listed below: Patient has been  on letrozole 2.5 mg daily since October 2020. Patient has manageable hot flash and joint stiffness.  Overall tolerates well. She has some intermittent right side flank pain usually triggered by certain position.  Pain spontaneously resolves. No fever, chills, nausea vomiting diarrhea.  Denies any breast concerns today.    Review of Systems  Constitutional: Negative for appetite change, chills, fatigue and fever.  HENT:   Negative for hearing loss and voice change.   Eyes: Negative for eye problems.  Respiratory: Negative for chest tightness and cough.   Cardiovascular: Negative for chest pain.  Gastrointestinal: Negative for abdominal distention, abdominal pain and blood in stool.  Endocrine: Positive for hot flashes.  Genitourinary: Negative for difficulty urinating and frequency.   Musculoskeletal: Positive for arthralgias.  Skin: Negative for itching and rash.  Neurological: Negative for extremity weakness.  Hematological: Negative for adenopathy.  Psychiatric/Behavioral: Negative for confusion.    MEDICAL HISTORY:  Past Medical History:  Diagnosis Date  . Asthma   . 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 Family history of breast cancer   . Family history of colon cancer   . Family history of lung cancer   . Family history of pancreatic cancer   . Family history of stomach cancer   . Fibrocystic breast   . GERD (gastroesophageal reflux disease)   . Hyperlipemia   . Hypertension   . Osteopenia 08/23/2019  . Personal history of radiation therapy   . Pneumonia     SURGICAL HISTORY: Past Surgical History:  Procedure Laterality Date  . BREAST BIOPSY Right 05/02/2019   right breast stereo bx of calcs, coil clip, path pending  . BREAST CYST ASPIRATION     ? side neg  . MASTECTOMY,  PARTIAL Right 05/24/2019   Procedure: MASTECTOMY PARTIAL, REEXCISION OF RIGHT BREAST;  Surgeon: Herbert Pun, MD;  Location: [facility] ORS;  Service: General;   Laterality: Right;  . PARTIAL MASTECTOMY WITH NEEDLE LOCALIZATION Right 05/15/2019   Procedure: PARTIAL MASTECTOMY WITH NEEDLE LOCALIZATION AND SENTINEL NODE, RIGHT;  Surgeon: Herbert Pun, MD;  Location: [facility] ORS;  Service: General;  Laterality: Right;  . TONSILLECTOMY    . TONSILLECTOMY AND ADENOIDECTOMY    . TUBAL LIGATION      SOCIAL HISTORY: Social History   Socioeconomic History  . Marital status: Married    Spouse name: Not on file  . Number of children: Not on file  . Years of education: Not on file  . Highest education level: Not on file  Occupational History  . Not on file  Tobacco Use  . Smoking status: Former Smoker    Packs/day: 1.00    Years: 3.00    Pack years: 3.00    Types: Cigarettes    Quit date: 1977    Years since quitting: 45.0  . Smokeless tobacco: Never Used  Vaping Use  . Vaping Use: Never used  Substance and Sexual Activity  . Alcohol use: Yes    Comment: rare wine  . Drug use: No  . Sexual activity: Not on file  Other Topics Concern  . Not on file  Social History Narrative  . Not on file   Social Determinants of Health   Financial Resource Strain: Not on file  Food Insecurity: Not on file  Transportation Needs: Not on file  Physical Activity: Not on file  Stress: Not on file  Social Connections: Not on file  Intimate Partner Violence: Not on file    FAMILY HISTORY: Family History  Problem Relation Age of Onset  . Hypertension Mother   . Breast cancer Mother 64  . Hyperlipidemia Mother   . Bladder Cancer Mother 56  . Diabetes Father   . Pancreatic cancer Maternal Aunt        dx 69s  . Stomach cancer Maternal Uncle        dx 22s  . Diabetes Paternal Grandmother   . Lung cancer Maternal Uncle        both dx 37s  . Colon cancer Cousin        dx 30s-40s  . Breast cancer Cousin        dx 48s in Hospice    ALLERGIES:  is allergic to hyoscyamine sulfate, latex, and other.  MEDICATIONS:  Current Outpatient  Medications  Medication Sig Dispense Refill  . albuterol (PROVENTIL HFA;VENTOLIN HFA) 108 (90 Base) MCG/ACT inhaler Inhale 1-2 puffs into the lungs every 6 (six) hours as needed for wheezing or shortness of breath. 1 Inhaler 0  . Ascorbic Acid (VITAMIN C) 1000 MG tablet Take 1 tablet by mouth daily.    Marland Kitchen atorvastatin (LIPITOR) 40 MG tablet Take 40 mg by mouth daily.    . B Complex-C (SUPER B COMPLEX/VITAMIN C PO)     . Biotin 5000 MCG CAPS Take by mouth.    . cetirizine (ZYRTEC) 10 MG tablet Take 10 mg by mouth daily.    . Cholecalciferol (VITAMIN D3) 25 MCG (1000 UT) CAPS Take by mouth.    . co-enzyme Q-10 50 MG capsule Take 100 mg by mouth daily.    . Collagen-Vitamin C (COLLAGEN PLUS VITAMIN C PO)     . ELDERBERRY PO Take by mouth.    . famotidine (PEPCID) 20 MG tablet Take  by mouth.    . fluticasone (FLONASE) 50 MCG/ACT nasal spray Place 2 sprays into both nostrils daily. 16 g 2  . KRILL OIL PO Take 1 capsule by mouth daily.    Marland Kitchen letrozole (FEMARA) 2.5 MG tablet Take 1 tablet (2.5 mg total) by mouth daily. 90 tablet 3  . lisinopril (ZESTRIL) 10 MG tablet Take 10 mg by mouth daily.    . Misc Natural Products (GLUCOSAMINE CHONDROITIN ADV PO) Take by mouth.     No current facility-administered medications for this visit.     PHYSICAL EXAMINATION: ECOG PERFORMANCE STATUS: 0 - Asymptomatic Vitals:   11/06/20 1354  BP: (!) 142/86  Pulse: 71  Resp: 18  Temp: 98.7 F (37.1 C)   Filed Weights   11/06/20 1354  Weight: 230 lb 3.2 oz (104.4 kg)    Physical Exam Constitutional:      General: She is not in acute distress. HENT:     Head: Normocephalic and atraumatic.  Eyes:     General: No scleral icterus.    Pupils: Pupils are equal, round, and reactive to light.  Cardiovascular:     Rate and Rhythm: Normal rate and regular rhythm.     Heart sounds: Normal heart sounds.  Pulmonary:     Effort: Pulmonary effort is normal. No respiratory distress.     Breath sounds: No  wheezing.  Abdominal:     General: Bowel sounds are normal. There is no distension.     Palpations: Abdomen is soft. There is no mass.     Tenderness: There is no abdominal tenderness.  Musculoskeletal:        General: No deformity. Normal range of motion.     Cervical back: Normal range of motion and neck supple.     Comments: Left lower extremity edema which patient reports being a chronic problem for her.  No calf tenderness  Skin:    General: Skin is warm and dry.     Findings: No erythema or rash.  Neurological:     Mental Status: She is alert and oriented to person, place, and time. Mental status is at baseline.     Cranial Nerves: No cranial nerve deficit.     Coordination: Coordination normal.  Psychiatric:        Mood and Affect: Mood normal.   Patient has had breast examination by Dr. Baruch Gouty in October 2021.  Defer examination  LABORATORY DATA:  I have reviewed the data as listed Lab Results  Component Value Date   WBC 6.0 11/06/2020   HGB 13.8 11/06/2020   HCT 40.2 11/06/2020   MCV 91.8 11/06/2020   PLT 259 11/06/2020   Recent Labs    12/19/19 1100 04/23/20 1254 11/06/20 1345  NA 142 141 139  K 3.6 4.0 4.0  CL 108 108 102  CO2 25 26 27   GLUCOSE 148* 118* 111*  BUN 21 17 18   CREATININE 0.93 0.82 0.93  CALCIUM 9.1 9.5 9.5  GFRNONAA >60 >60 >60  GFRAA >60 >60  --   PROT 6.3* 7.0 6.7  ALBUMIN 3.6 3.9 3.8  AST 21 22 24   ALT 24 23 28   ALKPHOS 71 75 71  BILITOT 0.6 0.7 0.9   Iron/TIBC/Ferritin/ %Sat No results found for: IRON, TIBC, FERRITIN, IRONPCTSAT   Patient had labs done at [place] [facility] on 05/09/2019.  Labs are reviewed.  RADIOGRAPHIC STUDIES: I have personally reviewed the radiological images as listed and agreed with the findings in the report. No results  found.  ASSESSMENT & PLAN:  1. Ductal carcinoma in situ (DCIS) of right breast   2. Osteopenia, unspecified location   3. Aromatase inhibitor use   High-grade DCIS, ER positive, status  post lumpectomy, and adjuvant radiation. Currently on adjuvant antiestrogen treatment with letrozole 2.5 mg.  Overall tolerates well. Continue current regimen.  Plan for 5 years. Obtain annual diagnostic mammogram in June 2022 #Osteopenia, continue calcium and vitamin D supplementation.  Exercise as tolerated Zometa 4 mg every 6 months.  Proceed with Zometa today . Family history of cancer, patient has family history of breast cancer, pancreatic cancer, stomach cancer. Refer to Dentist.  All questions were answered. The patient knows to call the clinic with any problems questions or concerns.  Return of visit: 6 months.  Rickard Patience, MD, PhD Hematology Oncology [facility] 11/06/2020

## 2021-02-06 ENCOUNTER — Other Ambulatory Visit: Payer: Self-pay | Admitting: Oncology

## 2021-04-14 IMAGING — MG DIGITAL DIAGNOSTIC UNILATERAL RIGHT MAMMOGRAM WITH TOMO AND CAD
8 series · 8 of 20 positions shown · non-contrast
Comparison: Previous exam(s).

CLINICAL DATA: Right breast calcifications seen on most recent
screening mammography.

EXAM:
DIGITAL DIAGNOSTIC UNILATERAL RIGHT MAMMOGRAM WITH CAD AND TOMO

[R ML]
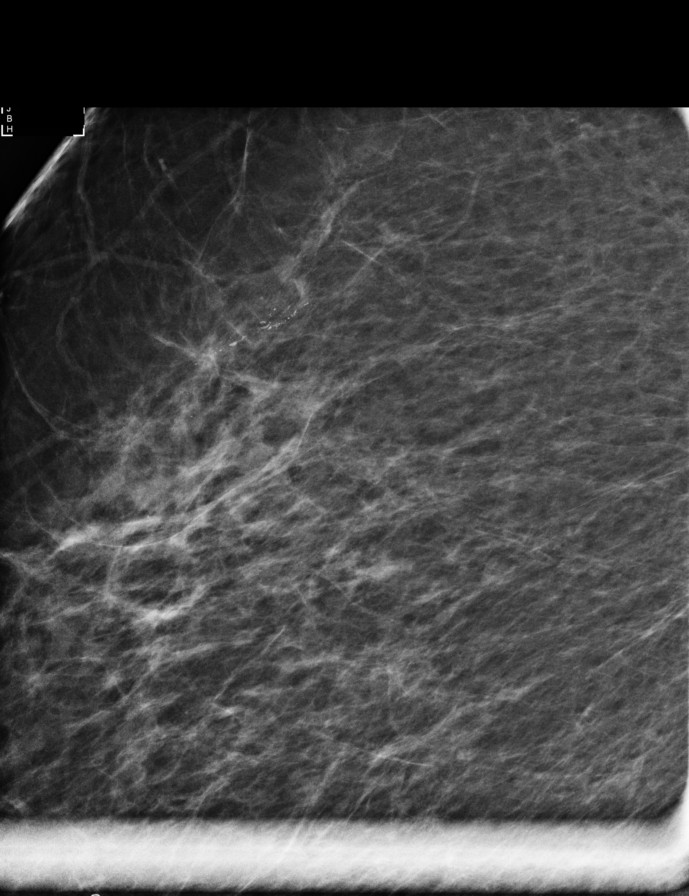

[R CC]
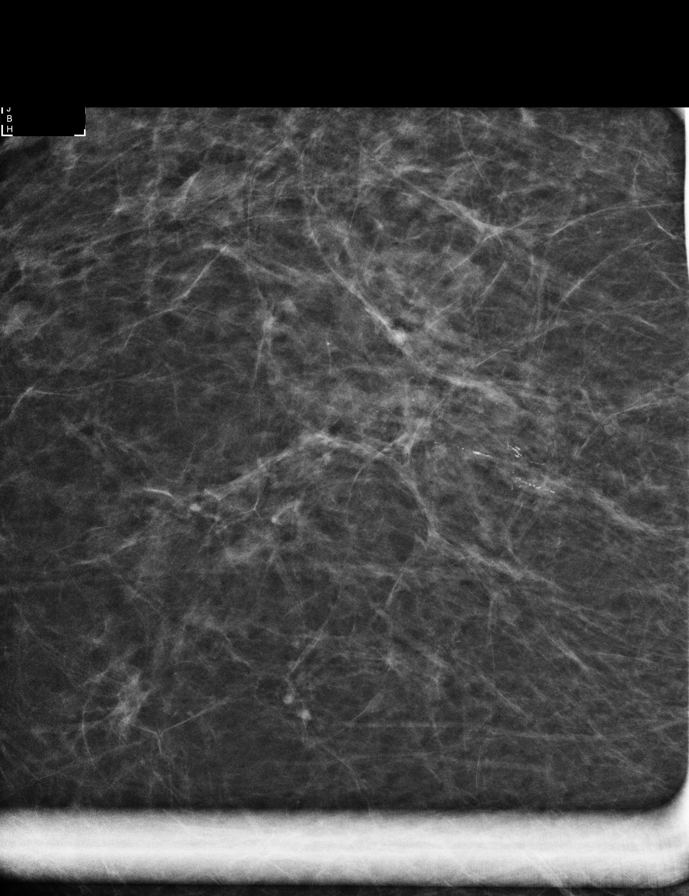

[R XCCL synth-2D]
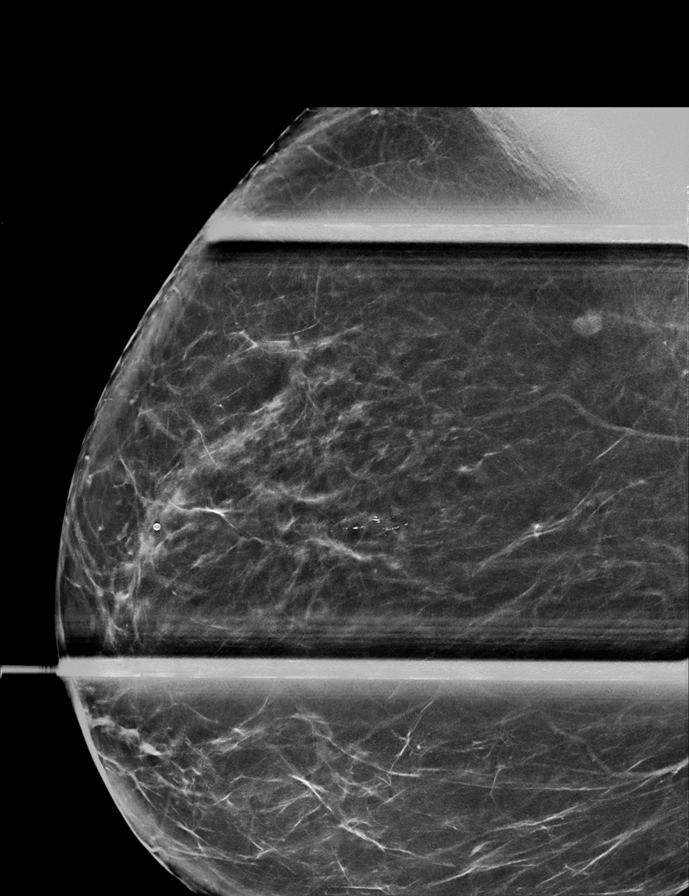

[R ML synth-2D]
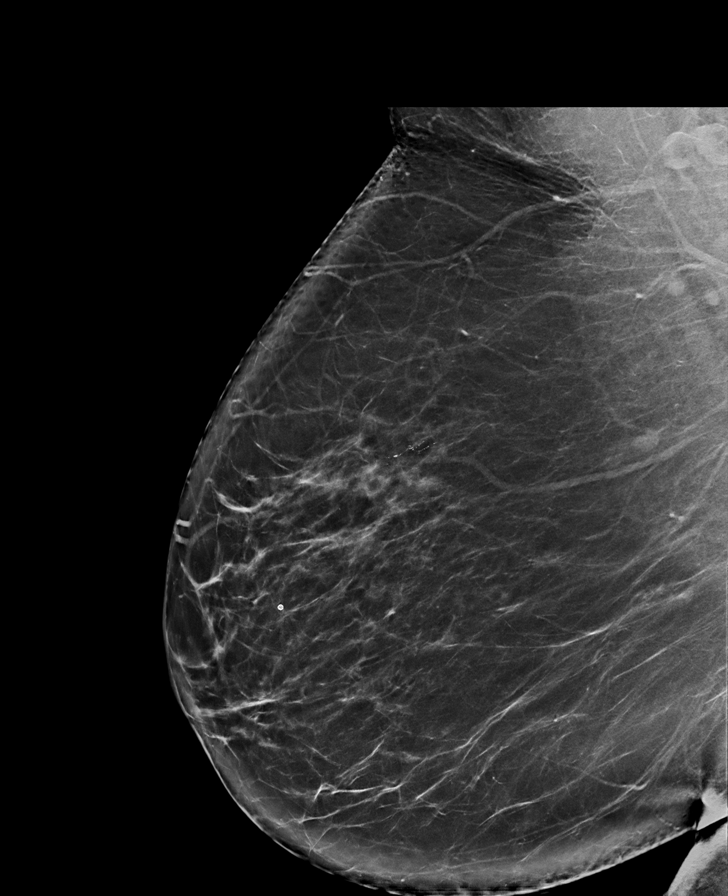

[R MLO synth-2D]
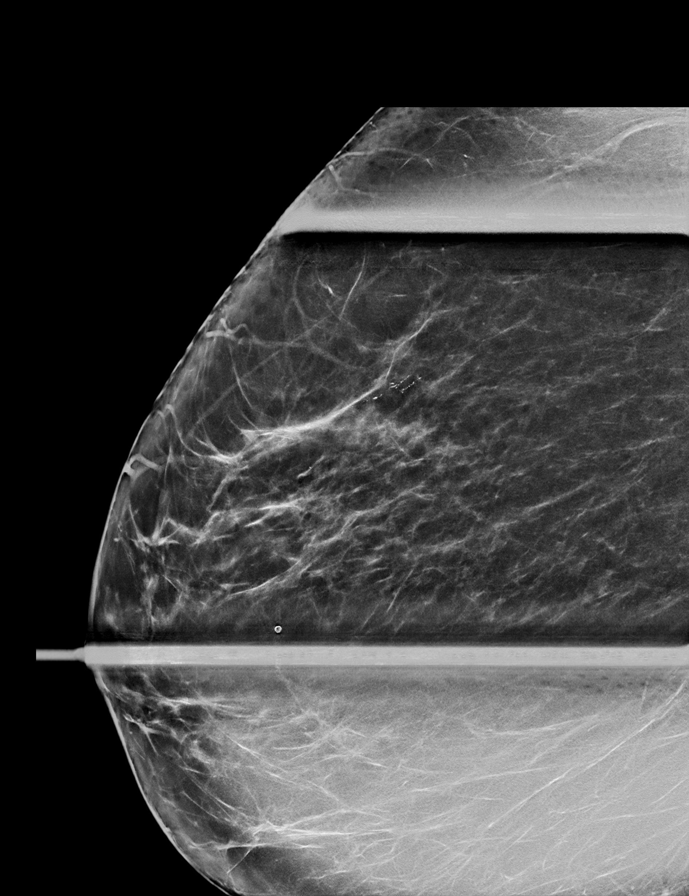

[R MLO tomo · tomo slice 39/76.0]
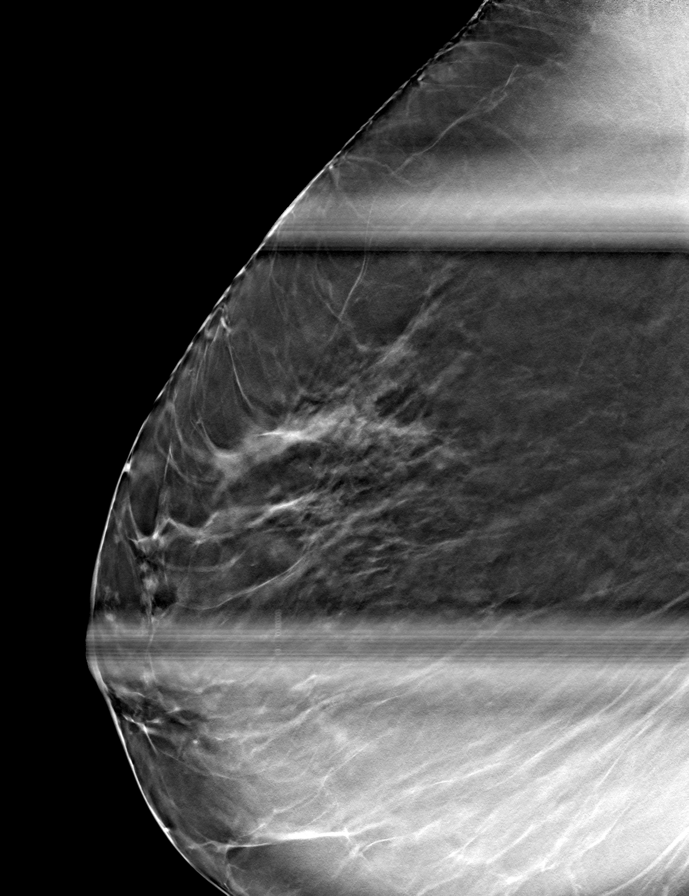

[R ML tomo · tomo slice 46/91.0]
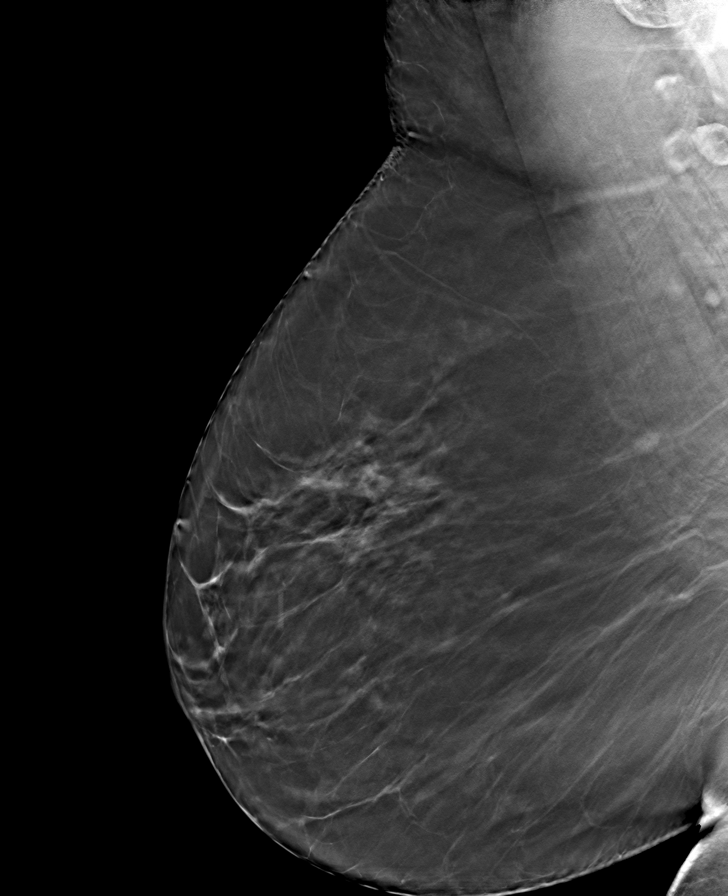

[R XCCL tomo · tomo slice 42/83.0]
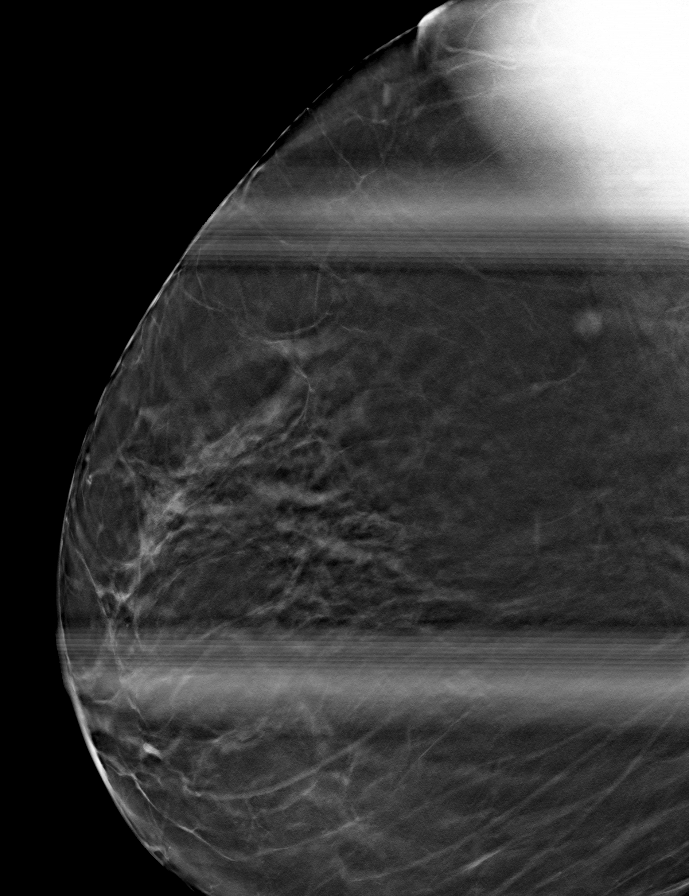

[8 of 20 positions shown; findings below may reference images not displayed]

ACR Breast Density Category b: There are scattered areas of
fibroglandular density.
FINDINGS: Additional mammographic views of the right breast demonstrate a
group punctate calcifications in linear distribution in the right
breast upper outer quadrant, middle depth. The group measures 2.3 x
0.4 x 0.8 cm. Faint tubular structures are seen associated with the
calcifications. No associated mass.

Mammographic images were processed with CAD.
IMPRESSION: Right breast upper outer quadrant indeterminate calcifications, for
which stereotactic core needle biopsy is recommended.

RECOMMENDATION:
Stereotactic core needle biopsy of the right breast.

Initially, either the anterior or posterior portion of the
calcifications may be sampled, and if the histology is malignant,
then a second stereotactic core needle biopsy may be considered to
determine the extent of disease.

I have discussed the findings and recommendations with the patient.
Results were also provided in writing at the conclusion of the
visit. If applicable, a reminder letter will be sent to the patient
regarding the next appointment.

BI-RADS CATEGORY  4: Suspicious.

## 2021-04-18 IMAGING — MG MM CLIP PLACEMENT
2 series · 2 of 2 positions shown · non-contrast
Comparison: Previous exam(s).

CLINICAL DATA: Status post stereotactic guided core biopsy of
calcifications in the UPPER central portion of the RIGHT breast.

EXAM:
DIAGNOSTIC RIGHT MAMMOGRAM POST STEREOTACTIC BIOPSY

[R CC]
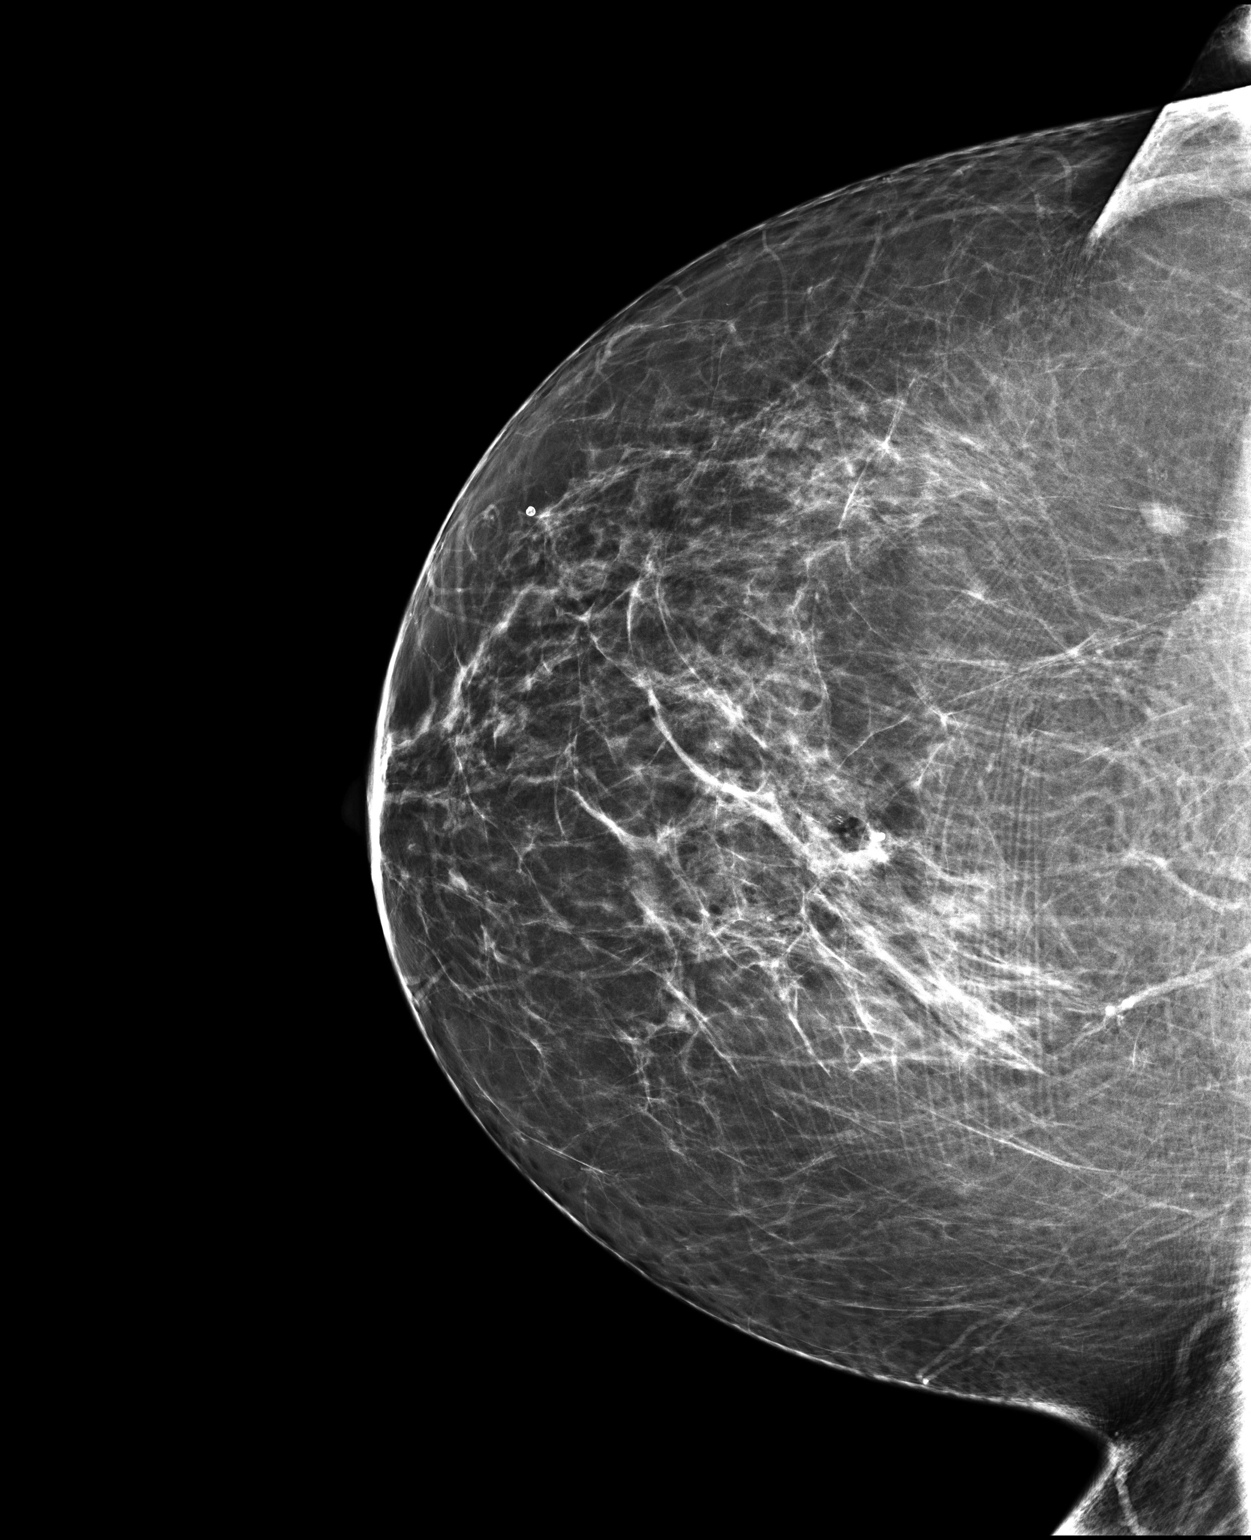

[R ML]
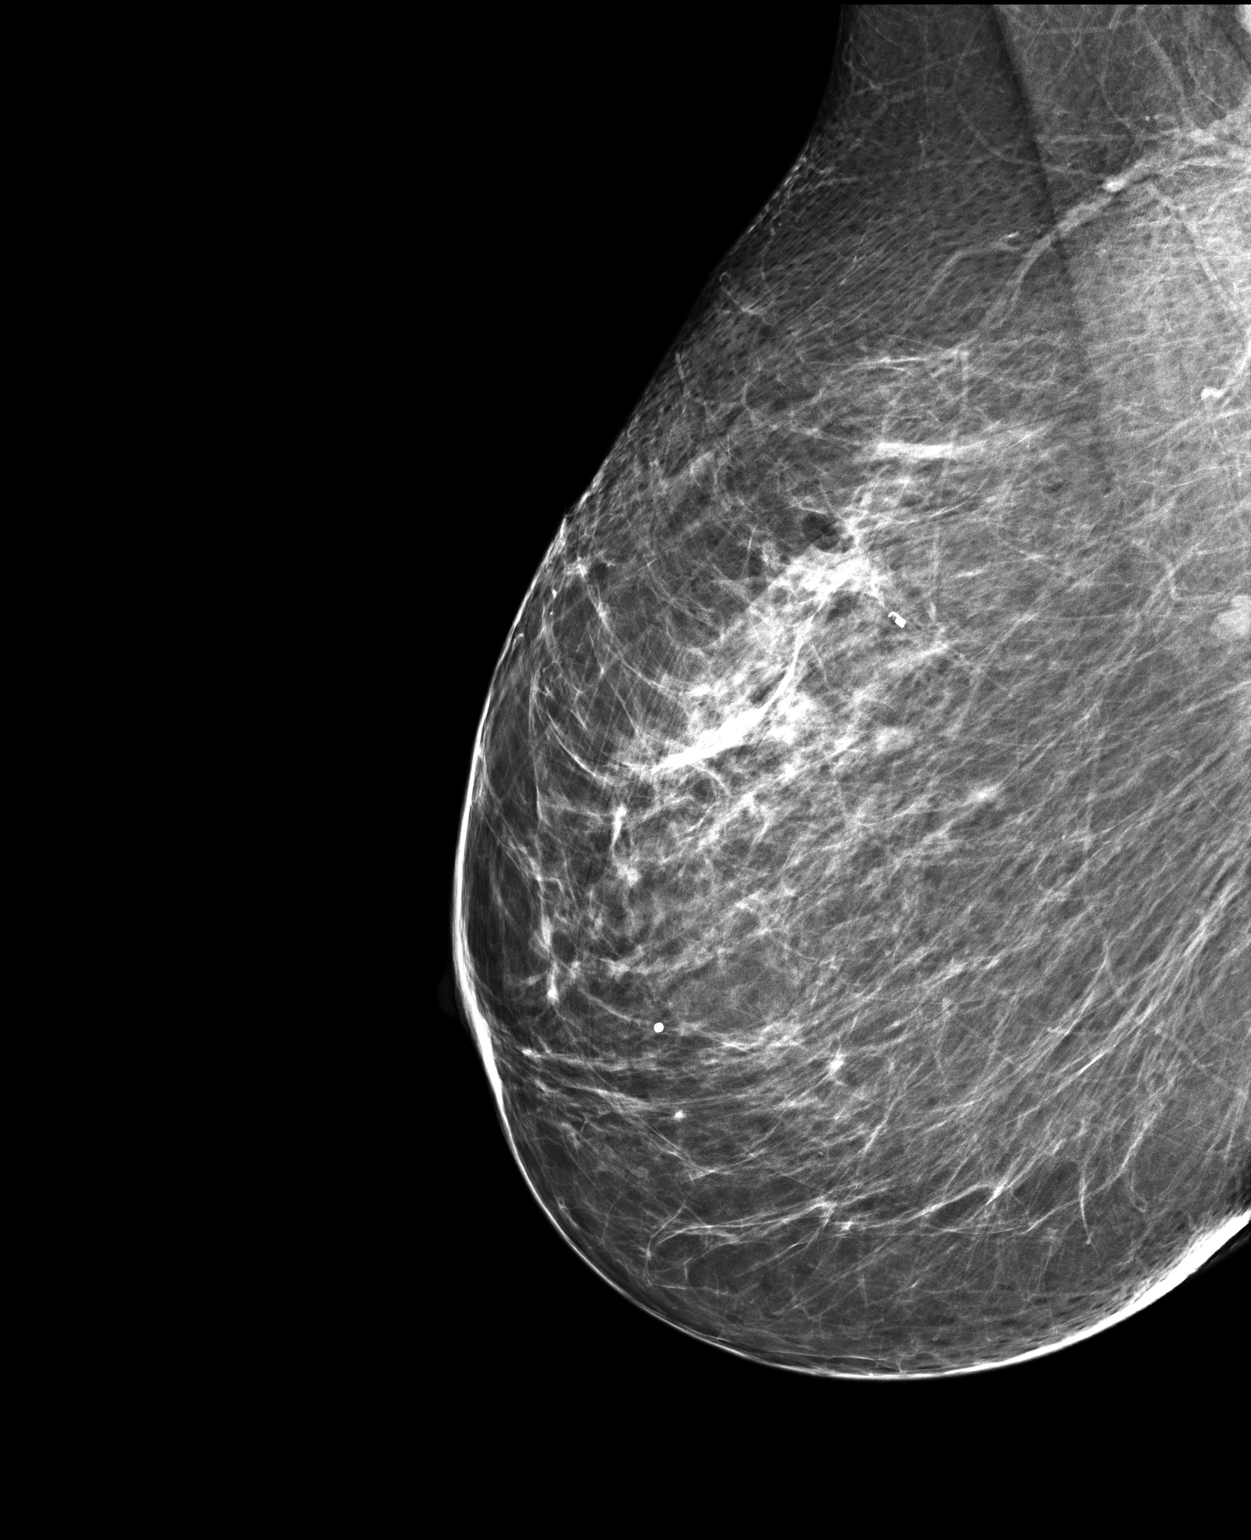

[2 of 2 positions shown; findings below may reference images not displayed]

FINDINGS: Mammographic images were obtained following stereotactic guided
biopsy of calcifications in the UPPER central portion of the RIGHT
breast and placement of a coil shaped clip. Coil shaped clip is
identified in the UPPER central portion of the RIGHT breast an
adjacent to residual faint calcifications.
IMPRESSION: Tissue marker clip in the expected location following biopsy.

Final Assessment: Post Procedure Mammograms for Marker Placement

## 2021-04-22 ENCOUNTER — Ambulatory Visit
Admission: RE | Admit: 2021-04-22 | Discharge: 2021-04-22 | Disposition: A | Discharge status: Home / Self Care | Payer: Medicare HMO | Source: Ambulatory Visit | Attending: Oncology | Admitting: Oncology

## 2021-04-22 ENCOUNTER — Other Ambulatory Visit: Payer: Self-pay

## 2021-04-22 DIAGNOSIS — D0511 Intraductal carcinoma in situ of right breast: Secondary | ICD-10-CM

## 2021-04-22 HISTORY — DX: Malignant neoplasm of unspecified site of unspecified female breast: C50.919

## 2021-04-23 ENCOUNTER — Inpatient Hospital Stay: Payer: Medicare HMO | Admitting: Oncology

## 2021-04-23 ENCOUNTER — Inpatient Hospital Stay: Payer: Medicare HMO

## 2021-04-23 ENCOUNTER — Inpatient Hospital Stay: Payer: Medicare HMO | Attending: Oncology

## 2021-04-23 ENCOUNTER — Encounter: Payer: Self-pay | Admitting: Oncology

## 2021-04-23 VITALS — BP 120/81 | HR 63 | Temp 97.8°F | Resp 16 | Wt 221.7 lb

## 2021-04-23 DIAGNOSIS — D0511 Intraductal carcinoma in situ of right breast: Secondary | ICD-10-CM | POA: Insufficient documentation

## 2021-04-23 DIAGNOSIS — Z79811 Long term (current) use of aromatase inhibitors: Secondary | ICD-10-CM | POA: Diagnosis not present

## 2021-04-23 DIAGNOSIS — Z170 Estrogen receptor positive status [ER+]: Secondary | ICD-10-CM | POA: Insufficient documentation

## 2021-04-23 DIAGNOSIS — M8580 Other specified disorders of bone density and structure, unspecified site: Secondary | ICD-10-CM | POA: Diagnosis not present

## 2021-04-23 LAB — CBC WITH DIFFERENTIAL/PLATELET
Abs Immature Granulocytes: 0.01 10*3/uL (ref 0.00–0.07)
Basophils Absolute: 0 10*3/uL (ref 0.0–0.1)
Basophils Relative: 0 %
Eosinophils Absolute: 0.1 10*3/uL (ref 0.0–0.5)
Eosinophils Relative: 2 %
HCT: 40.8 % (ref 36.0–46.0)
Hemoglobin: 14 g/dL (ref 12.0–15.0)
Immature Granulocytes: 0 %
Lymphocytes Relative: 31 %
Lymphs Abs: 1.8 10*3/uL (ref 0.7–4.0)
MCH: 31.3 pg (ref 26.0–34.0)
MCHC: 34.3 g/dL (ref 30.0–36.0)
MCV: 91.1 fL (ref 80.0–100.0)
Monocytes Absolute: 0.5 10*3/uL (ref 0.1–1.0)
Monocytes Relative: 9 %
Neutro Abs: 3.4 10*3/uL (ref 1.7–7.7)
Neutrophils Relative %: 58 %
Platelets: 259 10*3/uL (ref 150–400)
RBC: 4.48 MIL/uL (ref 3.87–5.11)
RDW: 12.5 % (ref 11.5–15.5)
WBC: 5.9 10*3/uL (ref 4.0–10.5)
nRBC: 0 % (ref 0.0–0.2)

## 2021-04-23 LAB — COMPREHENSIVE METABOLIC PANEL
ALT: 30 U/L (ref 0–44)
AST: 25 U/L (ref 15–41)
Albumin: 4 g/dL (ref 3.5–5.0)
Alkaline Phosphatase: 71 U/L (ref 38–126)
Anion gap: 7 (ref 5–15)
BUN: 17 mg/dL (ref 8–23)
CO2: 23 mmol/L (ref 22–32)
Calcium: 9.1 mg/dL (ref 8.9–10.3)
Chloride: 108 mmol/L (ref 98–111)
Creatinine, Ser: 0.82 mg/dL (ref 0.44–1.00)
GFR, Estimated: >60 mL/min (ref >60)
Glucose, Bld: 101 mg/dL — ABNORMAL HIGH (ref 70–99)
Potassium: 4.1 mmol/L (ref 3.5–5.1)
Sodium: 138 mmol/L (ref 135–145)
Total Bilirubin: 0.9 mg/dL (ref 0.3–1.2)
Total Protein: 7.1 g/dL (ref 6.5–8.1)

## 2021-04-23 MED ORDER — ZOLEDRONIC ACID 4 MG/100ML IV SOLN
4.0000 mg | Freq: Once | INTRAVENOUS | Status: AC
  Administered 2021-04-23: 4 mg via INTRAVENOUS
  Filled 2021-04-23: qty 100

## 2021-04-23 MED ORDER — LETROZOLE 2.5 MG PO TABS: 2.5000 mg | ORAL_TABLET | Freq: Every day | ORAL | 1 refills | Status: DC

## 2021-04-23 MED ORDER — SODIUM CHLORIDE 0.9 % IV SOLN
Freq: Once | INTRAVENOUS | Status: AC
Start: 2021-04-23 — End: 2021-04-23
  Filled 2021-04-23: qty 250

## 2021-04-23 NOTE — Progress Notes (Signed)
Patient denies new problems/concerns today.   °

## 2021-04-23 NOTE — Progress Notes (Signed)
Hematology/Oncology follow up  note Jane [facility] [phone] Fax:([phone]) [phone]   Patient Care Team: Jane Pilar, MD as PCP - General (Family Medicine) Jane Filbert, MD as Radiation Oncologist (Radiation Oncology)  REFERRING PROVIDER: Hortencia Pilar, MD  CHIEF COMPLAINTS/REASON FOR VISIT:  Follow up of DCIS  HISTORY OF PRESENTING ILLNESS:  Jane Gardner is a  65 y.o.  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  Estrogen receptor status, >90%  Nipple discharge: Denies Family history: Mother had history of breast cancer and bladder cancer.  Alive.  Maternal aunt has pancreatic cancer.  Maternal uncle has stomach cancer.  Maternal uncle has lung cancer. OCP use: Previous use of OCP Estrogen and progesterone therapy: Menopause in 81s, she was on hormone replacement for a few years. History of radiation to chest: denies.  No previous breast biopsy.  # 05/15/2019 patient underwent right lumpectomy with sentinel lymph node biopsy. 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  Regional lymph nodes uninvolved by tumor cells. pTis N0 05/24/2019 reexcision negative for DCIS. 08/09/2019 finished adjuvant radiation 08/23/2019 started on letrozole 2.5 mg INTERVAL HISTORY Jane Gardner is a 65 y.o. female who has above history reviewed by me today presents for follow up visit for management of DCIS Problems and complaints are listed below: Patient has been  on letrozole 2.5 mg daily since October 2020.  Patient reports tolerating well, manageable hot flash and joint stiffness No fever, chills, nausea vomiting diarrhea.  Denies any breast concerns today.    Review of Systems  Constitutional:  Negative for appetite change, chills, fatigue and fever.  HENT:   Negative for hearing loss and voice change.   Eyes:  Negative for eye problems.  Respiratory:  Negative for chest tightness and cough.   Cardiovascular:  Negative for chest pain.  Gastrointestinal:  Negative for abdominal distention, abdominal pain and blood in stool.  Endocrine: Positive for hot flashes.  Genitourinary:  Negative for difficulty urinating and frequency.   Musculoskeletal:  Positive for arthralgias.  Skin:  Negative for itching and rash.  Neurological:  Negative for extremity weakness.  Hematological:  Negative for adenopathy.  Psychiatric/Behavioral:  Negative for confusion.    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 side neg   MASTECTOMY, PARTIAL  Right 05/24/2019   Procedure: MASTECTOMY PARTIAL, REEXCISION OF RIGHT BREAST;  Surgeon: Herbert Pun, MD;  Location: [facility] ORS;  Service: General;  Laterality: Right;   PARTIAL MASTECTOMY WITH NEEDLE LOCALIZATION Right 05/15/2019   Procedure: PARTIAL  MASTECTOMY WITH NEEDLE LOCALIZATION AND SENTINEL NODE, RIGHT;  Surgeon: Herbert Pun, MD;  Location: [facility] OR[facility];  Service: General;  Laterality: Right;   TONSILLECTOMY     TONSILLECTOMY AND ADENOIDECTOMY     TUBAL LIGATION      SOCIAL HISTORY: Social History   Socioeconomic History   Marital status: Married    Spouse name: Not on file   Number of children: Not on file   Years of education: Not on file   Highest education level: Not on file  Occupational History   Not on file  Tobacco Use   Smoking status: Former    Packs/day: 1.00    Years: 3.00    Pack years: 3.00    Types: Cigarettes    Quit date: 69    Years since quitting: 45.5   Smokeless tobacco: Never  Vaping Use   Vaping Use: Never used  Substance and Sexual Activity   Alcohol use: Yes    Comment: rare wine   Drug use: No   Sexual activity: Not on file  Other Topics Concern   Not on file  Social History Narrative   Not on file   Social Determinants of Health   Financial Resource Strain: Not on file  Food Insecurity: Not on file  Transportation Needs: Not on file  Physical Activity: Not on file  Stress: Not on file  Social Connections: Not on file  Intimate Partner Violence: Not on file    FAMILY HISTORY: Family History  Problem Relation Age of Onset   Hypertension Mother    Breast cancer Mother 13   Hyperlipidemia Mother    Bladder Cancer Mother 9   Diabetes Father    Pancreatic cancer Maternal Aunt        dx 61s   Stomach cancer Maternal Uncle        dx 54s   Diabetes Paternal Grandmother    Lung cancer Maternal Uncle        both dx 67s   Colon cancer Cousin        dx 30s-40s   Breast cancer Cousin        dx 53s in Hospice    ALLERGIES:  is allergic to hyoscyamine sulfate, latex, and other.  MEDICATIONS:  Current Outpatient Medications  Medication Sig Dispense Refill   albuterol (PROVENTIL HFA;VENTOLIN HFA) 108 (90 Base) MCG/ACT inhaler Inhale 1-2 puffs into the lungs every  6 (six) hours as needed for wheezing or shortness of breath. 1 Inhaler 0   Ascorbic Acid (VITAMIN C) 1000 MG tablet Take 1 tablet by mouth daily.     atorvastatin (LIPITOR) 40 MG tablet Take 40 mg by mouth daily.     B Complex-C (SUPER B COMPLEX/VITAMIN C PO)      Biotin 5000 MCG CAPS Take by mouth.     cetirizine (ZYRTEC) 10 MG tablet Take 10 mg by mouth daily.     Cholecalciferol (VITAMIN D3) 25 MCG (1000 UT) CAPS Take by mouth.     co-enzyme Q-10 50 MG capsule Take 100 mg by mouth daily.     Collagen-Vitamin C (COLLAGEN PLUS VITAMIN C PO)      ELDERBERRY PO Take by mouth.     famotidine (PEPCID) 20 MG tablet Take by mouth.  fluticasone (FLONASE) 50 MCG/ACT nasal spray Place 2 sprays into both nostrils daily. 16 g 2   KRILL OIL PO Take 1 capsule by mouth daily.     letrozole (FEMARA) 2.5 MG tablet Take 1 tablet by mouth once daily 90 tablet 0   lisinopril (ZESTRIL) 10 MG tablet Take 10 mg by mouth daily.     Misc Natural Products (GLUCOSAMINE CHONDROITIN ADV PO) Take by mouth.     No current facility-administered medications for this visit.     PHYSICAL EXAMINATION: ECOG PERFORMANCE STATUS: 0 - Asymptomatic Vitals:   04/23/21 1317  BP: 120/81  Pulse: 63  Resp: 16  Temp: 97.8 F (36.6 C)   Filed Weights   04/23/21 1317  Weight: 221 lb 11.2 oz (100.6 kg)    Physical Exam Constitutional:      General: She is not in acute distress. HENT:     Head: Normocephalic and atraumatic.  Eyes:     General: No scleral icterus.    Pupils: Pupils are equal, round, and reactive to light.  Cardiovascular:     Rate and Rhythm: Normal rate and regular rhythm.     Heart sounds: Normal heart sounds.  Pulmonary:     Effort: Pulmonary effort is normal. No respiratory distress.     Breath sounds: No wheezing.  Abdominal:     General: Bowel sounds are normal. There is no distension.     Palpations: Abdomen is soft. There is no mass.     Tenderness: There is no abdominal tenderness.   Musculoskeletal:        General: No deformity. Normal range of motion.     Cervical back: Normal range of motion and neck supple.     Comments: Left lower extremity edema which is chronic  Skin:    General: Skin is warm and dry.     Findings: No erythema or rash.  Neurological:     Mental Status: She is alert and oriented to person, place, and time. Mental status is at baseline.     Cranial Nerves: No cranial nerve deficit.     Coordination: Coordination normal.  Psychiatric:        Mood and Affect: Mood normal.    LABORATORY DATA:  I have reviewed the data as listed Lab Results  Component Value Date   WBC 6.0 11/06/2020   HGB 13.8 11/06/2020   HCT 40.2 11/06/2020   MCV 91.8 11/06/2020   PLT 259 11/06/2020   Recent Labs    11/06/20 1345  NA 139  K 4.0  CL 102  CO2 27  GLUCOSE 111*  BUN 18  CREATININE 0.93  CALCIUM 9.5  GFRNONAA >60  PROT 6.7  ALBUMIN 3.8  AST 24  ALT 28  ALKPHOS 71  BILITOT 0.9    Iron/TIBC/Ferritin/ %Sat No results found for: IRON, TIBC, FERRITIN, IRONPCTSAT   Patient had labs done at [facility] on 05/09/2019.  Labs are reviewed.  RADIOGRAPHIC STUDIES: I have personally reviewed the radiological images as listed and agreed with the findings in the report. MM DIAG BREAST TOMO BILATERAL  Result Date: 04/22/2021 CLINICAL DATA:  History of RIGHT breast cancer with lumpectomy and radiation treatment in July 2020. EXAM: DIGITAL DIAGNOSTIC BILATERAL MAMMOGRAM WITH TOMOSYNTHESIS AND CAD TECHNIQUE: Bilateral digital diagnostic mammography and breast tomosynthesis was performed. The images were evaluated with computer-aided detection. COMPARISON:  Previous exam(s). ACR Breast Density Category b: There are scattered areas of fibroglandular density. FINDINGS: Post operative changes are seen in the  RIGHTbreast. No suspicious mass, distortion, or microcalcifications are identified to suggest presence of malignancy. IMPRESSION: No mammographic evidence  for malignancy. RECOMMENDATION: Diagnostic mammogram is suggested in 1 year. (Code:DM-B-01Y) I have discussed the findings and recommendations with the patient. If applicable, a reminder letter will be sent to the patient regarding the next appointment. BI-RADS CATEGORY  2: Benign. Electronically Signed   By: Nolon Nations M.D.   On: 04/22/2021 11:40   ASSESSMENT & PLAN:  1. Ductal carcinoma in situ (DCIS) of right breast   2. Aromatase inhibitor use   3. Osteopenia, unspecified location   High-grade DCIS, ER positive, status post lumpectomy, and adjuvant radiation. Continue letrozole 2.5 mg.  Overall tolerates well. Plan for total 5 years. annual diagnostic mammogram in June 2022 was reviewed with patient.  Plan diagnostic mammogram in 1 year.  #Osteopenia, continue calcium and vitamin D supplementation.  Recommend exercise as tolerated Zometa 4 mg every 6 months.  Proceed with Zometa today Obtain Zometa in Oct 2022 . Family history of cancer, patient has family history of breast cancer, pancreatic cancer, stomach cancer. 7/6/2021[facility] genetic testing was negative  All questions were answered. The patient knows to call the clinic with any problems questions or concerns.  Return of visit: 6 months.  Earlie Server, MD, PhD Hematology Oncology [place] [facility] 04/23/2021

## 2021-04-24 ENCOUNTER — Encounter: Payer: Self-pay | Admitting: Oncology

## 2021-05-01 IMAGING — MG BREAST SURGICAL SPECIMEN
1 series · 1 of 1 positions shown · non-contrast
Comparison: Previous exam(s).

CLINICAL DATA: Evaluate specimen

EXAM:
SPECIMEN RADIOGRAPH OF THE RIGHT BREAST

[R SPECIMEN]
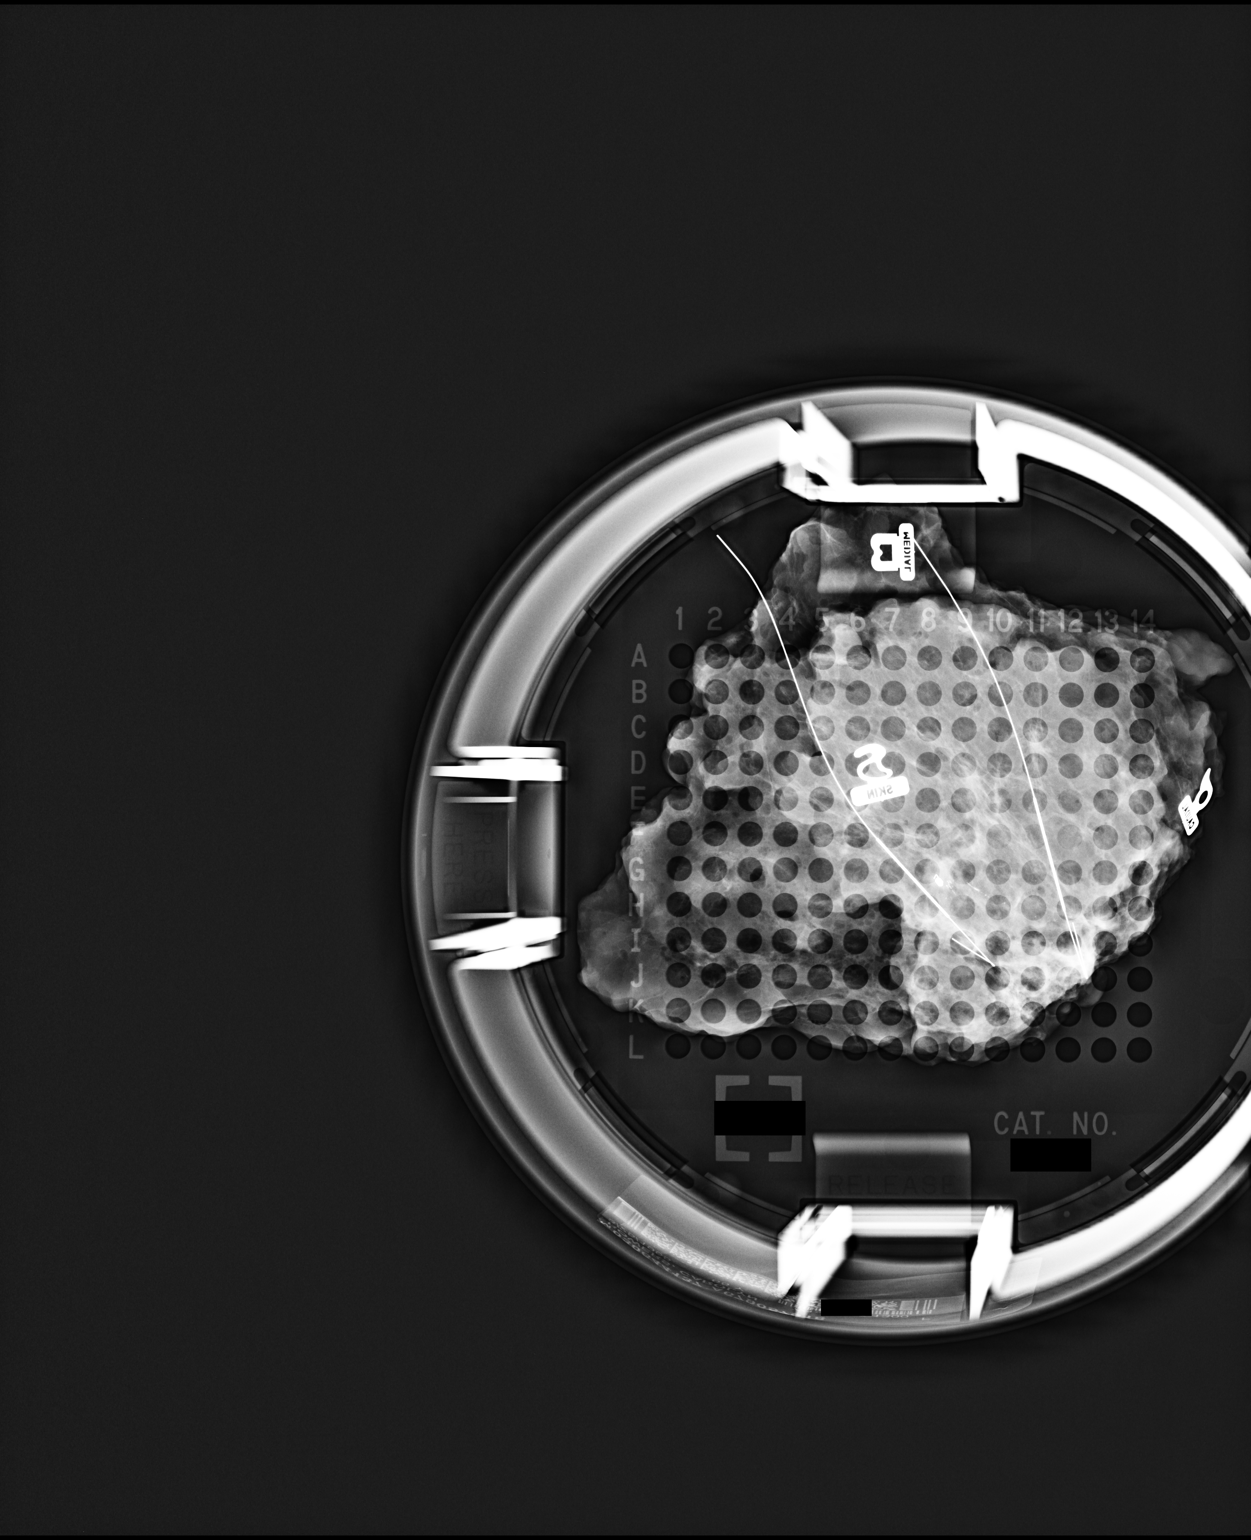

[1 of 1 positions shown; findings below may reference images not displayed]

FINDINGS: Status post excision of the right breast. The wire tips and biopsy
marker clip are present and are marked for pathology.
IMPRESSION: Specimen radiograph of the right breast.

## 2021-08-14 ENCOUNTER — Ambulatory Visit
Admission: RE | Admit: 2021-08-14 | Discharge: 2021-08-14 | Disposition: A | Discharge status: Home / Self Care | Payer: Medicare HMO | Source: Ambulatory Visit | Attending: Radiation Oncology | Admitting: Radiation Oncology

## 2021-08-14 ENCOUNTER — Other Ambulatory Visit: Payer: Self-pay

## 2021-08-14 VITALS — BP 128/80 | HR 70 | Temp 96.0°F | Resp 16 | Wt 220.0 lb

## 2021-08-14 DIAGNOSIS — C50411 Malignant neoplasm of upper-outer quadrant of right female breast: Secondary | ICD-10-CM

## 2021-08-14 NOTE — Progress Notes (Signed)
Radiation Oncology Follow up Note  Name: Jane Gardner   Date:   08/14/2021 MRN:  [id number] DOB: 1956/06/12    This 65 y.o. female presents to the clinic today for 2-year follow-up status post whole breast radiation to her right breast for ER/PR positive ductal carcinoma in situ.  REFERRING PROVIDER: Hortencia Pilar, MD  HPI: Patient is a 65 year old female now out 2 years having completed whole breast radiation to her right breast for ER/PR positive ductal carcinoma in situ.  Seen today in routine follow-up she is doing well.  She specifically denies breast tenderness cough or bone pain..  She had mammograms back in June which I have reviewed were BI-RADS 2 benign.  She is currently on letrozole tolerating it well without side effect.  COMPLICATIONS OF TREATMENT: none  FOLLOW UP COMPLIANCE: keeps appointments   PHYSICAL EXAM:  BP 128/80 (BP Location: Left Arm, Patient Position: Sitting)   Pulse 70   Temp (!) 96 F (35.6 C) (Tympanic)   Resp 16   Wt 220 lb (99.8 kg)   BMI 38.36 kg/m  Lungs are clear to A&P cardiac examination essentially unremarkable with regular rate and rhythm. No dominant mass or nodularity is noted in either breast in 2 positions examined. Incision is well-healed. No axillary or supraclavicular adenopathy is appreciated. Cosmetic result is excellent.  Well-developed well-nourished patient in NAD. HEENT reveals PERLA, EOMI, discs not visualized.  Oral cavity is clear. No oral mucosal lesions are identified. Neck is clear without evidence of cervical or supraclavicular adenopathy. Lungs are clear to A&P. Cardiac examination is essentially unremarkable with regular rate and rhythm without murmur rub or thrill. Abdomen is benign with no organomegaly or masses noted. Motor sensory and DTR levels are equal and symmetric in the upper and lower extremities. Cranial nerves II through XII are grossly intact. Proprioception is intact. No peripheral adenopathy or edema is  identified. No motor or sensory levels are noted. Crude visual fields are within normal range.  RADIOLOGY RESULTS: Mammograms reviewed compatible with above-stated findings  PLAN: Present time patient is doing well no evidence of disease 2 years out I am pleased with her overall progress.  I have asked to see her back in 1 year for follow-up.  She will have mammogram scheduled.  She continues on letrozole without side effect.  Patient knows to call with any concerns.  I would like to take this opportunity to thank you for allowing me to participate in the care of your patient.Noreene Filbert, MD

## 2021-08-21 ENCOUNTER — Other Ambulatory Visit: Payer: Self-pay

## 2021-08-21 ENCOUNTER — Ambulatory Visit
Admission: RE | Admit: 2021-08-21 | Discharge: 2021-08-21 | Disposition: A | Discharge status: Home / Self Care | Payer: Medicare HMO | Source: Ambulatory Visit | Attending: Oncology | Admitting: Oncology

## 2021-08-21 DIAGNOSIS — M8580 Other specified disorders of bone density and structure, unspecified site: Secondary | ICD-10-CM | POA: Diagnosis present

## 2021-08-21 DIAGNOSIS — Z79811 Long term (current) use of aromatase inhibitors: Secondary | ICD-10-CM | POA: Diagnosis present

## 2021-08-21 DIAGNOSIS — D0511 Intraductal carcinoma in situ of right breast: Secondary | ICD-10-CM | POA: Insufficient documentation

## 2021-10-16 ENCOUNTER — Other Ambulatory Visit: Payer: Self-pay | Admitting: *Deleted

## 2021-10-16 DIAGNOSIS — D0511 Intraductal carcinoma in situ of right breast: Secondary | ICD-10-CM

## 2021-10-16 DIAGNOSIS — M8580 Other specified disorders of bone density and structure, unspecified site: Secondary | ICD-10-CM

## 2021-10-24 ENCOUNTER — Other Ambulatory Visit: Payer: Self-pay

## 2021-10-24 ENCOUNTER — Inpatient Hospital Stay: Payer: Medicare HMO | Attending: Oncology

## 2021-10-24 ENCOUNTER — Inpatient Hospital Stay: Payer: Medicare HMO | Admitting: Oncology

## 2021-10-24 ENCOUNTER — Inpatient Hospital Stay: Payer: Medicare HMO

## 2021-10-24 VITALS — BP 104/74 | HR 84 | Temp 97.2°F | Resp 16 | Wt 220.5 lb

## 2021-10-24 DIAGNOSIS — Z170 Estrogen receptor positive status [ER+]: Secondary | ICD-10-CM | POA: Insufficient documentation

## 2021-10-24 DIAGNOSIS — Z79811 Long term (current) use of aromatase inhibitors: Secondary | ICD-10-CM | POA: Insufficient documentation

## 2021-10-24 DIAGNOSIS — D0511 Intraductal carcinoma in situ of right breast: Secondary | ICD-10-CM | POA: Diagnosis present

## 2021-10-24 DIAGNOSIS — M8580 Other specified disorders of bone density and structure, unspecified site: Secondary | ICD-10-CM | POA: Insufficient documentation

## 2021-10-24 LAB — COMPREHENSIVE METABOLIC PANEL
ALT: 24 U/L (ref 0–44)
AST: 23 U/L (ref 15–41)
Albumin: 3.8 g/dL (ref 3.5–5.0)
Alkaline Phosphatase: 77 U/L (ref 38–126)
Anion gap: 9 (ref 5–15)
BUN: 18 mg/dL (ref 8–23)
CO2: 28 mmol/L (ref 22–32)
Calcium: 9.1 mg/dL (ref 8.9–10.3)
Chloride: 101 mmol/L (ref 98–111)
Creatinine, Ser: 0.84 mg/dL (ref 0.44–1.00)
GFR, Estimated: >60 mL/min (ref >60)
Glucose, Bld: 107 mg/dL — ABNORMAL HIGH (ref 70–99)
Potassium: 3.8 mmol/L (ref 3.5–5.1)
Sodium: 138 mmol/L (ref 135–145)
Total Bilirubin: 0.6 mg/dL (ref 0.3–1.2)
Total Protein: 6.9 g/dL (ref 6.5–8.1)

## 2021-10-24 LAB — CBC WITH DIFFERENTIAL/PLATELET
Abs Immature Granulocytes: 0.02 10*3/uL (ref 0.00–0.07)
Basophils Absolute: 0 10*3/uL (ref 0.0–0.1)
Basophils Relative: 1 %
Eosinophils Absolute: 0.2 10*3/uL (ref 0.0–0.5)
Eosinophils Relative: 3 %
HCT: 39.5 % (ref 36.0–46.0)
Hemoglobin: 13.3 g/dL (ref 12.0–15.0)
Immature Granulocytes: 0 %
Lymphocytes Relative: 30 %
Lymphs Abs: 1.6 10*3/uL (ref 0.7–4.0)
MCH: 31.6 pg (ref 26.0–34.0)
MCHC: 33.7 g/dL (ref 30.0–36.0)
MCV: 93.8 fL (ref 80.0–100.0)
Monocytes Absolute: 0.6 10*3/uL (ref 0.1–1.0)
Monocytes Relative: 10 %
Neutro Abs: 2.9 10*3/uL (ref 1.7–7.7)
Neutrophils Relative %: 56 %
Platelets: 259 10*3/uL (ref 150–400)
RBC: 4.21 MIL/uL (ref 3.87–5.11)
RDW: 12.7 % (ref 11.5–15.5)
WBC: 5.3 10*3/uL (ref 4.0–10.5)
nRBC: 0 % (ref 0.0–0.2)

## 2021-10-24 MED ORDER — SODIUM CHLORIDE 0.9 % IV SOLN
Freq: Once | INTRAVENOUS | Status: AC
  Filled 2021-10-24: qty 250

## 2021-10-24 MED ORDER — ZOLEDRONIC ACID 4 MG/100ML IV SOLN
4.0000 mg | Freq: Once | INTRAVENOUS | Status: AC
  Administered 2021-10-24: 11:00:00 4 mg via INTRAVENOUS
  Filled 2021-10-24: qty 100

## 2021-10-24 MED ORDER — LETROZOLE 2.5 MG PO TABS: 2.5000 mg | ORAL_TABLET | Freq: Every day | ORAL | 1 refills | Status: DC

## 2021-10-24 NOTE — Progress Notes (Signed)
Pt requesting letrozole refill today. Wanting to know if it is safe to donate blood/plasma while on letrozole.

## 2021-10-24 NOTE — Patient Instructions (Signed)

## 2021-10-25 ENCOUNTER — Encounter: Payer: Self-pay | Admitting: Oncology

## 2021-10-25 NOTE — Progress Notes (Signed)
Hematology/Oncology follow up  note  [phone] [phone]   Patient Care Team: Hortencia Pilar, MD as PCP - General (Family Medicine) Noreene Filbert, MD as Radiation Oncologist (Radiation Oncology)  REFERRING PROVIDER: Hortencia Pilar, MD  CHIEF COMPLAINTS/REASON FOR VISIT:  Follow up of DCIS  HISTORY OF PRESENTING ILLNESS:  Jane Gardner is a  65 y.o.  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  Estrogen receptor status, >90%  Nipple discharge: Denies Family history: Mother had history of breast cancer and bladder cancer.  Alive.  Maternal aunt has pancreatic cancer.  Maternal uncle has stomach cancer.  Maternal uncle has lung cancer. OCP use: Previous use of OCP Estrogen and progesterone therapy: Menopause in 56s, she was on hormone replacement for a few years. History of radiation to chest: denies.  No previous breast biopsy.  # 05/15/2019 patient underwent right lumpectomy with sentinel lymph node biopsy. 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  Regional lymph nodes uninvolved by tumor cells. pTis N0 05/24/2019 reexcision negative for DCIS. 08/09/2019 finished adjuvant radiation 08/23/2019 started on letrozole 2.5 mg   Family history of cancer, patient has family history of breast cancer, pancreatic cancer, stomach cancer. 7/6/2021[facility] genetic testing was negative   INTERVAL HISTORY Jane Gardner is a 65 y.o. female who has above history  reviewed by me today presents for follow up visit for management of DCIS Patient has been on Letrozole 2.5mg  daily, she tolerates well. Manageable side effects.  Denies any new breast concerns.     Review of Systems  Constitutional:  Negative for appetite change, chills, fatigue and fever.  HENT:   Negative for hearing loss and voice change.   Eyes:  Negative for eye problems.  Respiratory:  Negative for chest tightness and cough.   Cardiovascular:  Negative for chest pain.  Gastrointestinal:  Negative for abdominal distention, abdominal pain and blood in stool.  Endocrine: Positive for hot flashes.  Genitourinary:  Negative for difficulty urinating and frequency.   Musculoskeletal:  Positive for arthralgias.  Skin:  Negative for itching and rash.  Neurological:  Negative for extremity weakness.  Hematological:  Negative for adenopathy.  Psychiatric/Behavioral:  Negative for confusion.    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 side neg   MASTECTOMY,  PARTIAL Right 05/24/2019   Procedure: MASTECTOMY PARTIAL, REEXCISION OF RIGHT BREAST;  Surgeon: Herbert Pun, MD;  Location: [facility] ORS;  Service: General;  Laterality: Right;   PARTIAL MASTECTOMY WITH NEEDLE LOCALIZATION Right 05/15/2019   Procedure: PARTIAL MASTECTOMY  WITH NEEDLE LOCALIZATION AND SENTINEL NODE, RIGHT;  Surgeon: Herbert Pun, MD;  Location: [facility] OR[facility];  Service: General;  Laterality: Right;   TONSILLECTOMY     TONSILLECTOMY AND ADENOIDECTOMY     TUBAL LIGATION      SOCIAL HISTORY: Social History   Socioeconomic History   Marital status: Married    Spouse name: Not on file   Number of children: Not on file   Years of education: Not on file   Highest education level: Not on file  Occupational History   Not on file  Tobacco Use   Smoking status: Former    Packs/day: 1.00    Years: 3.00    Pack years: 3.00    Types: Cigarettes    Quit date: 26    Years since quitting: 46.0   Smokeless tobacco: Never  Vaping Use   Vaping Use: Never used  Substance and Sexual Activity   Alcohol use: Yes    Comment: rare wine   Drug use: No   Sexual activity: Not on file  Other Topics Concern   Not on file  Social History Narrative   Not on file   Social Determinants of Health   Financial Resource Strain: Not on file  Food Insecurity: Not on file  Transportation Needs: Not on file  Physical Activity: Not on file  Stress: Not on file  Social Connections: Not on file  Intimate Partner Violence: Not on file    FAMILY HISTORY: Family History  Problem Relation Age of Onset   Hypertension Mother    Breast cancer Mother 6   Hyperlipidemia Mother    Bladder Cancer Mother 42   Diabetes Father    Pancreatic cancer Maternal Aunt        dx 66s   Stomach cancer Maternal Uncle        dx 2s   Diabetes Paternal Grandmother    Lung cancer Maternal Uncle        both dx 41s   Colon cancer Cousin        dx 30s-40s   Breast cancer Cousin        dx 12s in Hospice    ALLERGIES:  is allergic to hyoscyamine sulfate, latex, and other.  MEDICATIONS:  Current Outpatient Medications  Medication Sig Dispense Refill   albuterol (PROVENTIL HFA;VENTOLIN HFA) 108 (90 Base) MCG/ACT inhaler Inhale 1-2 puffs into the lungs every 6 (six)  hours as needed for wheezing or shortness of breath. 1 Inhaler 0   Ascorbic Acid (VITAMIN C) 1000 MG tablet Take 1 tablet by mouth daily.     atorvastatin (LIPITOR) 40 MG tablet Take 40 mg by mouth daily.     B Complex-C (SUPER B COMPLEX/VITAMIN C PO)      Biotin 5000 MCG CAPS Take by mouth.     Calcium Carbonate-Vit D-Min (CALCIUM 1200 PO) Take by mouth.     cetirizine (ZYRTEC) 10 MG tablet Take 10 mg by mouth daily.     Cholecalciferol (VITAMIN D3) 25 MCG (1000 UT) CAPS Take by mouth.     co-enzyme Q-10 50 MG capsule Take 100 mg by mouth daily.     Collagen-Vitamin C (COLLAGEN PLUS VITAMIN C PO)      ELDERBERRY PO Take by  mouth.     famotidine (PEPCID) 20 MG tablet Take by mouth.     fluticasone (FLONASE) 50 MCG/ACT nasal spray Place 2 sprays into both nostrils daily. 16 g 2   lisinopril (ZESTRIL) 10 MG tablet Take 10 mg by mouth daily.     Misc Natural Products (GLUCOSAMINE CHONDROITIN ADV PO) Take by mouth.     letrozole (FEMARA) 2.5 MG tablet Take 1 tablet (2.5 mg total) by mouth daily. 90 tablet 1   No current facility-administered medications for this visit.     PHYSICAL EXAMINATION: ECOG PERFORMANCE STATUS: 0 - Asymptomatic Vitals:   10/24/21 1011  BP: 104/74  Pulse: 84  Resp: 16  Temp: (!) 97.2 F (36.2 C)  SpO2: 99%   Filed Weights   10/24/21 1011  Weight: 220 lb 8 oz (100 kg)    Physical Exam Constitutional:      General: She is not in acute distress. HENT:     Head: Normocephalic and atraumatic.  Eyes:     General: No scleral icterus.    Pupils: Pupils are equal, round, and reactive to light.  Cardiovascular:     Rate and Rhythm: Normal rate and regular rhythm.     Heart sounds: Normal heart sounds.  Pulmonary:     Effort: Pulmonary effort is normal. No respiratory distress.     Breath sounds: No wheezing.  Abdominal:     General: Bowel sounds are normal. There is no distension.     Palpations: Abdomen is soft. There is no mass.     Tenderness: There  is no abdominal tenderness.  Musculoskeletal:        General: No deformity. Normal range of motion.     Cervical back: Normal range of motion and neck supple.     Comments: Left lower extremity edema which is chronic  Skin:    General: Skin is warm and dry.     Findings: No erythema or rash.  Neurological:     Mental Status: She is alert and oriented to person, place, and time. Mental status is at baseline.     Cranial Nerves: No cranial nerve deficit.     Coordination: Coordination normal.  Psychiatric:        Mood and Affect: Mood normal.    LABORATORY DATA:  I have reviewed the data as listed Lab Results  Component Value Date   WBC 5.3 10/24/2021   HGB 13.3 10/24/2021   HCT 39.5 10/24/2021   MCV 93.8 10/24/2021   PLT 259 10/24/2021   Recent Labs    11/06/20 1345 04/23/21 1258 10/24/21 1002  NA 139 138 138  K 4.0 4.1 3.8  CL 102 108 101  CO2 27 23 28   GLUCOSE 111* 101* 107*  BUN 18 17 18   CREATININE 0.93 0.82 0.84  CALCIUM 9.5 9.1 9.1  GFRNONAA >60 >60 >60  PROT 6.7 7.1 6.9  ALBUMIN 3.8 4.0 3.8  AST 24 25 23   ALT 28 30 24   ALKPHOS 71 71 77  BILITOT 0.9 0.9 0.6    Iron/TIBC/Ferritin/ %Sat No results found for: IRON, TIBC, FERRITIN, IRONPCTSAT    RADIOGRAPHIC STUDIES: I have personally reviewed the radiological images as listed and agreed with the findings in the report. DG Bone Density  Result Date: 08/21/2021 EXAM: DUAL X-RAY ABSORPTIOMETRY (DXA) FOR BONE MINERAL DENSITY IMPRESSION: Your patient Jane Gardner completed a BMD test on 08/21/2021 using the [employer] (software version: 14.10) manufactured by [employer]. The following  summarizes the results of our evaluation. Technologist: [facility] PATIENT BIOGRAPHICAL: Name: Jane, Gardner Patient ID: [id number] Birth Date: [birth year] Height: 63.5 in. Gender: Female Exam Date: 08/21/2021 Weight: 218.8 lbs. Indications: Postmenopausal, Caucasian, History of Radiation, History of Breast  Cancer, History of Fracture (Adult) Fractures: Left foot Treatments: Calcium, Femara, Vitamin D, Zometa DENSITOMETRY RESULTS: Site      Region     Measured Date Measured Age WHO Classification Young Adult T-score BMD         %Change vs. Previous Significant Change (*) AP Spine L1-L3 6/21/[age].5 Normal -0.8 1.086 g/cm2 - - DualFemur Neck Right 08/21/2019 67.5 Osteopenia -2.2 0.733 g/cm2 2.2% - DualFemur Neck Right 08/21/2019 65.5 Osteopenia -2.3 0.717 g/cm2 - - DualFemur Total Mean 08/21/2021 67.5 Osteopenia -1.1 0.871 g/cm2 2.5% Yes DualFemur Total Mean 08/21/2019 65.5 Osteopenia -1.3 0.850 g/cm2 - - ASSESSMENT: The BMD measured at Femur Neck Right is 0.733 g/cm2 with a T-score of -2.2. This patient is considered osteopenic according to [facility]) criteria. The scan quality is good. L-4 was excluded due to degenerative changes. Compared with prior study, there has been significant increase in the total hip. Patient is not a candidate for FRAX due to Zometa. [facility]) criteria for post-menopausal, Caucasian Women: Normal:                   T-score at or above -1 SD Osteopenia/low bone mass: T-score between -1 and -2.5 SD Osteoporosis:             T-score at or below -2.5 SD RECOMMENDATIONS: 1. All patients should optimize calcium and vitamin D intake. 2. Consider FDA-approved medical therapies in postmenopausal women and men aged 5 years and older, based on the following: a. A hip or vertebral(clinical or morphometric) fracture b. T-score < -2.5 at the femoral neck or spine after appropriate evaluation to exclude secondary causes c. Low bone mass (T-score between -1.0 and -2.5 at the femoral neck or spine) and a 10-year probability of a hip fracture > 3% or a 10-year probability of a major osteoporosis-related fracture > 20% based on the [place]-adapted WHO algorithm 3. Clinician judgment and/or patient preferences may indicate treatment for people with 10-year fracture  probabilities above or below these levels FOLLOW-UP: People with diagnosed cases of osteoporosis or at high risk for fracture should have regular bone mineral density tests. For patients eligible for Medicare, routine testing is allowed once every 2 years. The testing frequency can be increased to one year for patients who have rapidly progressing disease, those who are receiving or discontinuing medical therapy to restore bone mass, or have additional risk factors. I have reviewed this report, and agree with the above findings. [facility] Electronically Signed   By: Rolm Baptise M.D.   On: 08/21/2021 20:33    ASSESSMENT & PLAN:  1. Ductal carcinoma in situ (DCIS) of right breast    #High-grade DCIS, ER positive, status post lumpectomy, and adjuvant radiation. Continue letrozole 2.5 mg, plan for total 5 years, -08/2024. Plan diagnostic mammogram in June 2023.  #Osteopenia, continue calcium and vitamin D supplementation.  Recommend exercise as tolerated Zometa 4 mg every 6 months.  Proceed with Zometa today 08/21/2021 Bone Density showed osteopenia, stable.   All questions were answered. The patient knows to call the clinic with any problems questions or concerns.  Return of visit: 6 months.  Earlie Server, MD, PhD 10/25/2021

## 2021-11-04 ENCOUNTER — Other Ambulatory Visit: Payer: Self-pay

## 2021-11-04 DIAGNOSIS — D0511 Intraductal carcinoma in situ of right breast: Secondary | ICD-10-CM

## 2021-11-13 ENCOUNTER — Telehealth: Payer: Self-pay | Admitting: Oncology

## 2021-11-13 NOTE — Telephone Encounter (Signed)
Pt called to reschedule her appt for June. Call back at [phone]

## 2022-04-23 ENCOUNTER — Ambulatory Visit
Admission: RE | Admit: 2022-04-23 | Discharge: 2022-04-23 | Disposition: A | Discharge status: Home / Self Care | Payer: Medicare HMO | Source: Ambulatory Visit | Attending: Oncology | Admitting: Oncology

## 2022-04-23 DIAGNOSIS — D0511 Intraductal carcinoma in situ of right breast: Secondary | ICD-10-CM

## 2022-04-23 DIAGNOSIS — Z1231 Encounter for screening mammogram for malignant neoplasm of breast: Secondary | ICD-10-CM | POA: Diagnosis present

## 2022-04-24 ENCOUNTER — Other Ambulatory Visit: Payer: Medicare HMO

## 2022-04-24 ENCOUNTER — Ambulatory Visit: Payer: Medicare HMO | Admitting: Oncology

## 2022-04-27 ENCOUNTER — Telehealth: Payer: Self-pay

## 2022-04-27 ENCOUNTER — Other Ambulatory Visit: Payer: Medicare HMO

## 2022-04-27 ENCOUNTER — Other Ambulatory Visit: Payer: Self-pay | Admitting: Oncology

## 2022-04-27 ENCOUNTER — Ambulatory Visit: Payer: Medicare HMO | Admitting: Oncology

## 2022-04-27 DIAGNOSIS — Z1231 Encounter for screening mammogram for malignant neoplasm of breast: Secondary | ICD-10-CM

## 2022-04-27 DIAGNOSIS — N6489 Other specified disorders of breast: Secondary | ICD-10-CM

## 2022-04-27 DIAGNOSIS — R928 Other abnormal and inconclusive findings on diagnostic imaging of breast: Secondary | ICD-10-CM

## 2022-04-27 NOTE — Telephone Encounter (Signed)
Per secure chat from [place]:   "Pt left a VM that her PCP drew her labs today and she will not have her scheduled lab appt tomorrow since her PCP drew what Dr. Cathie Hoops was. She will report for the 10:30 appt with Dr. Cathie Hoops. Please advise on canceling the lab appt for tomorrow."   Informed Brooke patient will need to bring lab results with her to appt tomorrow.   Per Efraim Kaufmann, patient verbalized understanding to bring lab results with her to her appt with Dr. Cathie Hoops tomorrow.

## 2022-04-28 ENCOUNTER — Inpatient Hospital Stay: Payer: Medicare HMO | Attending: Oncology

## 2022-04-28 ENCOUNTER — Telehealth: Payer: Self-pay

## 2022-04-28 ENCOUNTER — Inpatient Hospital Stay: Payer: Medicare HMO | Admitting: Oncology

## 2022-04-28 ENCOUNTER — Encounter: Payer: Self-pay | Admitting: Oncology

## 2022-04-28 VITALS — BP 112/80 | HR 72 | Temp 97.4°F | Resp 18 | Wt 224.5 lb

## 2022-04-28 DIAGNOSIS — D0511 Intraductal carcinoma in situ of right breast: Secondary | ICD-10-CM | POA: Insufficient documentation

## 2022-04-28 DIAGNOSIS — Z79811 Long term (current) use of aromatase inhibitors: Secondary | ICD-10-CM

## 2022-04-28 DIAGNOSIS — Z170 Estrogen receptor positive status [ER+]: Secondary | ICD-10-CM | POA: Diagnosis not present

## 2022-04-28 DIAGNOSIS — M8580 Other specified disorders of bone density and structure, unspecified site: Secondary | ICD-10-CM | POA: Diagnosis not present

## 2022-04-28 LAB — CBC WITH DIFFERENTIAL/PLATELET
Abs Immature Granulocytes: 0.01 10*3/uL (ref 0.00–0.07)
Basophils Absolute: 0 10*3/uL (ref 0.0–0.1)
Basophils Relative: 1 %
Eosinophils Absolute: 0.2 10*3/uL (ref 0.0–0.5)
Eosinophils Relative: 3 %
HCT: 41.4 % (ref 36.0–46.0)
Hemoglobin: 14 g/dL (ref 12.0–15.0)
Immature Granulocytes: 0 %
Lymphocytes Relative: 35 %
Lymphs Abs: 1.9 10*3/uL (ref 0.7–4.0)
MCH: 31.7 pg (ref 26.0–34.0)
MCHC: 33.8 g/dL (ref 30.0–36.0)
MCV: 93.9 fL (ref 80.0–100.0)
Monocytes Absolute: 0.6 10*3/uL (ref 0.1–1.0)
Monocytes Relative: 11 %
Neutro Abs: 2.7 10*3/uL (ref 1.7–7.7)
Neutrophils Relative %: 50 %
Platelets: 268 10*3/uL (ref 150–400)
RBC: 4.41 MIL/uL (ref 3.87–5.11)
RDW: 12.7 % (ref 11.5–15.5)
WBC: 5.4 10*3/uL (ref 4.0–10.5)
nRBC: 0 % (ref 0.0–0.2)

## 2022-04-28 MED ORDER — LETROZOLE 2.5 MG PO TABS: 2.5000 mg | ORAL_TABLET | Freq: Every day | ORAL | 2 refills | Status: DC

## 2022-04-28 NOTE — Progress Notes (Signed)
Pt here for follow up. Pt would like to discuss mammogram results. Pt also requesting refill on letrozole.

## 2022-04-30 ENCOUNTER — Inpatient Hospital Stay: Payer: Medicare HMO

## 2022-04-30 VITALS — BP 148/92 | HR 71 | Temp 97.5°F | Resp 16

## 2022-04-30 DIAGNOSIS — D0511 Intraductal carcinoma in situ of right breast: Secondary | ICD-10-CM | POA: Diagnosis not present

## 2022-04-30 DIAGNOSIS — M8580 Other specified disorders of bone density and structure, unspecified site: Secondary | ICD-10-CM

## 2022-04-30 MED ORDER — ZOLEDRONIC ACID 4 MG/100ML IV SOLN
4.0000 mg | Freq: Once | INTRAVENOUS | Status: AC
  Administered 2022-04-30: 4 mg via INTRAVENOUS
  Filled 2022-04-30: qty 100

## 2022-04-30 MED ORDER — SODIUM CHLORIDE 0.9 % IV SOLN
Freq: Once | INTRAVENOUS | Status: AC
  Filled 2022-04-30: qty 250

## 2022-04-30 NOTE — Patient Instructions (Signed)
[facility] [place]  Discharge Instructions: Thank you for choosing [facility] to provide your oncology and hematology care.  If you have a lab appointment with the Cancer Center, please go directly to the Cancer Center and check in at the registration area.  Wear comfortable clothing and clothing appropriate for easy access to any Portacath or PICC line.   We strive to give you quality time with your provider. You may need to reschedule your appointment if you arrive late (15 or more minutes).  Arriving late affects you and other patients whose appointments are after yours.  Also, if you miss three or more appointments without notifying the office, you may be dismissed from the clinic at the provider's discretion.      For prescription refill requests, have your pharmacy contact our office and allow 72 hours for refills to be completed.    Today you received the following chemotherapy and/or immunotherapy agents Zometa.      To help prevent nausea and vomiting after your treatment, we encourage you to take your nausea medication as directed.  BELOW ARE SYMPTOMS THAT SHOULD BE REPORTED IMMEDIATELY: *FEVER GREATER THAN 100.4 F (38 C) OR HIGHER *CHILLS OR SWEATING *NAUSEA AND VOMITING THAT IS NOT CONTROLLED WITH YOUR NAUSEA MEDICATION *UNUSUAL SHORTNESS OF BREATH *UNUSUAL BRUISING OR BLEEDING *URINARY PROBLEMS (pain or burning when urinating, or frequent urination) *BOWEL PROBLEMS (unusual diarrhea, constipation, pain near the anus) TENDERNESS IN MOUTH AND THROAT WITH OR WITHOUT PRESENCE OF ULCERS (sore throat, sores in mouth, or a toothache) UNUSUAL RASH, SWELLING OR PAIN  UNUSUAL VAGINAL DISCHARGE OR ITCHING   Items with * indicate a potential emergency and should be followed up as soon as possible or go to the Emergency Department if any problems should occur.  Please show the CHEMOTHERAPY ALERT CARD or IMMUNOTHERAPY ALERT CARD at check-in to the  Emergency Department and triage nurse.  Should you have questions after your visit or need to cancel or reschedule your appointment, please contact [facility] [place]  [phone] and follow the prompts.  Office hours are 8:00 a.m. to 4:30 p.m. Monday - Friday. Please note that voicemails left after 4:00 p.m. may not be returned until the following business day.  We are closed weekends and major holidays. You have access to a nurse at all times for urgent questions. Please call the main number to the clinic [phone] and follow the prompts.  For any non-urgent questions, you may also contact your provider using MyChart. We now offer e-Visits for anyone 18 and older to request care online for non-urgent symptoms. For details visit [email or web address].   Also download the MyChart app! Go to the app store, search "MyChart", open the app, select [place], and log in with your MyChart username and password.  Masks are optional in the cancer centers. If you would like for your care team to wear a mask while they are taking care of you, please let them know. For doctor visits, patients may have with them one support person who is at least 68 years old. At this time, visitors are not allowed in the infusion area.   

## 2022-05-01 ENCOUNTER — Other Ambulatory Visit: Payer: Self-pay | Admitting: Family Medicine

## 2022-05-01 DIAGNOSIS — R928 Other abnormal and inconclusive findings on diagnostic imaging of breast: Secondary | ICD-10-CM

## 2022-05-01 DIAGNOSIS — N6489 Other specified disorders of breast: Secondary | ICD-10-CM

## 2022-05-21 ENCOUNTER — Ambulatory Visit
Admission: RE | Admit: 2022-05-21 | Discharge: 2022-05-21 | Disposition: A | Discharge status: Home / Self Care | Payer: Medicare HMO | Source: Ambulatory Visit | Attending: Oncology | Admitting: Oncology

## 2022-05-21 DIAGNOSIS — R928 Other abnormal and inconclusive findings on diagnostic imaging of breast: Secondary | ICD-10-CM | POA: Insufficient documentation

## 2022-05-21 DIAGNOSIS — N6489 Other specified disorders of breast: Secondary | ICD-10-CM

## 2022-07-08 DIAGNOSIS — M545 Low back pain, unspecified: Secondary | ICD-10-CM | POA: Insufficient documentation

## 2022-08-13 ENCOUNTER — Encounter: Payer: Self-pay | Admitting: Radiation Oncology

## 2022-08-13 ENCOUNTER — Ambulatory Visit
Admission: RE | Admit: 2022-08-13 | Discharge: 2022-08-13 | Disposition: A | Discharge status: Home / Self Care | Payer: Medicare HMO | Source: Ambulatory Visit | Attending: Radiation Oncology | Admitting: Radiation Oncology

## 2022-08-13 VITALS — BP 127/98 | HR 72 | Temp 97.0°F | Resp 16 | Ht 63.5 in | Wt 226.0 lb

## 2022-08-13 DIAGNOSIS — D0511 Intraductal carcinoma in situ of right breast: Secondary | ICD-10-CM | POA: Insufficient documentation

## 2022-08-13 DIAGNOSIS — Z170 Estrogen receptor positive status [ER+]: Secondary | ICD-10-CM | POA: Insufficient documentation

## 2022-08-13 DIAGNOSIS — Z923 Personal history of irradiation: Secondary | ICD-10-CM | POA: Insufficient documentation

## 2022-08-13 DIAGNOSIS — C50411 Malignant neoplasm of upper-outer quadrant of right female breast: Secondary | ICD-10-CM

## 2022-08-13 DIAGNOSIS — Z79811 Long term (current) use of aromatase inhibitors: Secondary | ICD-10-CM | POA: Diagnosis not present

## 2022-08-13 NOTE — Progress Notes (Signed)
Radiation Oncology Follow up Note  Name: Jane Gardner   Date:   08/13/2022 MRN:  [id number] DOB: 12-11-1955    This 66 y.o. female presents to the clinic today for over 3-year follow-up status post whole breast radiation to her right breast for ER/PR positive ductal carcinoma in situ.  REFERRING PROVIDER: Hortencia Pilar, MD  HPI: Patient is a 66 year old female now out over 3 years having completed whole breast radiation to her right breast for ER/PR positive ductal carcinoma in situ seen today in routine follow-up she doing well.  She specifically denies breast tenderness cough or bone pain..  Mammograms which I have reviewed recently were BI-RADS 2 benign.  She is currently on Femara tolerating well without side effect.  COMPLICATIONS OF TREATMENT: none  FOLLOW UP COMPLIANCE: keeps appointments   PHYSICAL EXAM:  BP (!) 127/98   Pulse 72   Temp (!) 97 F (36.1 C)   Resp 16   Ht 5' 3.5" (1.613 m)   Wt 226 lb (102.5 kg)   BMI 39.41 kg/m  Lungs are clear to A&P cardiac examination essentially unremarkable with regular rate and rhythm. No dominant mass or nodularity is noted in either breast in 2 positions examined. Incision is well-healed. No axillary or supraclavicular adenopathy is appreciated. Cosmetic result is excellent.  Well-developed well-nourished patient in NAD. HEENT reveals PERLA, EOMI, discs not visualized.  Oral cavity is clear. No oral mucosal lesions are identified. Neck is clear without evidence of cervical or supraclavicular adenopathy. Lungs are clear to A&P. Cardiac examination is essentially unremarkable with regular rate and rhythm without murmur rub or thrill. Abdomen is benign with no organomegaly or masses noted. Motor sensory and DTR levels are equal and symmetric in the upper and lower extremities. Cranial nerves II through XII are grossly intact. Proprioception is intact. No peripheral adenopathy or edema is identified. No motor or sensory levels are noted.  Crude visual fields are within normal range.  RADIOLOGY RESULTS: Mammograms reviewed compatible with above-stated findings  PLAN: The present time patient is doing well now at over 3 years with no evidence of disease for DCIS.  At this time I am going to discontinue follow-up care.  Patient is to call with any concerns.  She continues close follow-up care with medical oncology.  I would like to take this opportunity to thank you for allowing me to participate in the care of your patient.Noreene Filbert, MD

## 2022-10-28 ENCOUNTER — Encounter: Payer: Self-pay | Admitting: Oncology

## 2022-10-28 ENCOUNTER — Inpatient Hospital Stay: Payer: Medicare HMO | Attending: Oncology

## 2022-10-28 ENCOUNTER — Inpatient Hospital Stay (HOSPITAL_BASED_OUTPATIENT_CLINIC_OR_DEPARTMENT_OTHER): Payer: Medicare HMO | Admitting: Oncology

## 2022-10-28 ENCOUNTER — Inpatient Hospital Stay: Payer: Medicare HMO

## 2022-10-28 VITALS — BP 119/87 | HR 78 | Temp 98.0°F | Resp 18 | Wt 227.9 lb

## 2022-10-28 DIAGNOSIS — Z79811 Long term (current) use of aromatase inhibitors: Secondary | ICD-10-CM | POA: Insufficient documentation

## 2022-10-28 DIAGNOSIS — M8580 Other specified disorders of bone density and structure, unspecified site: Secondary | ICD-10-CM

## 2022-10-28 DIAGNOSIS — D0511 Intraductal carcinoma in situ of right breast: Secondary | ICD-10-CM | POA: Insufficient documentation

## 2022-10-28 DIAGNOSIS — Z170 Estrogen receptor positive status [ER+]: Secondary | ICD-10-CM | POA: Insufficient documentation

## 2022-10-28 LAB — COMPREHENSIVE METABOLIC PANEL
ALT: 24 U/L (ref 0–44)
AST: 25 U/L (ref 15–41)
Albumin: 4 g/dL (ref 3.5–5.0)
Alkaline Phosphatase: 71 U/L (ref 38–126)
Anion gap: 7 (ref 5–15)
BUN: 19 mg/dL (ref 8–23)
CO2: 27 mmol/L (ref 22–32)
Calcium: 9.3 mg/dL (ref 8.9–10.3)
Chloride: 107 mmol/L (ref 98–111)
Creatinine, Ser: 0.88 mg/dL (ref 0.44–1.00)
GFR, Estimated: >60 mL/min (ref >60)
Glucose, Bld: 110 mg/dL — ABNORMAL HIGH (ref 70–99)
Potassium: 4.1 mmol/L (ref 3.5–5.1)
Sodium: 141 mmol/L (ref 135–145)
Total Bilirubin: 0.7 mg/dL (ref 0.3–1.2)
Total Protein: 6.9 g/dL (ref 6.5–8.1)

## 2022-10-28 LAB — CBC WITH DIFFERENTIAL/PLATELET
Abs Immature Granulocytes: 0.01 10*3/uL (ref 0.00–0.07)
Basophils Absolute: 0 10*3/uL (ref 0.0–0.1)
Basophils Relative: 1 %
Eosinophils Absolute: 0.2 10*3/uL (ref 0.0–0.5)
Eosinophils Relative: 3 %
HCT: 41 % (ref 36.0–46.0)
Hemoglobin: 13.8 g/dL (ref 12.0–15.0)
Immature Granulocytes: 0 %
Lymphocytes Relative: 34 %
Lymphs Abs: 2 10*3/uL (ref 0.7–4.0)
MCH: 31.2 pg (ref 26.0–34.0)
MCHC: 33.7 g/dL (ref 30.0–36.0)
MCV: 92.8 fL (ref 80.0–100.0)
Monocytes Absolute: 0.6 10*3/uL (ref 0.1–1.0)
Monocytes Relative: 10 %
Neutro Abs: 3 10*3/uL (ref 1.7–7.7)
Neutrophils Relative %: 52 %
Platelets: 280 10*3/uL (ref 150–400)
RBC: 4.42 MIL/uL (ref 3.87–5.11)
RDW: 12 % (ref 11.5–15.5)
WBC: 5.7 10*3/uL (ref 4.0–10.5)
nRBC: 0 % (ref 0.0–0.2)

## 2022-10-28 MED ORDER — ZOLEDRONIC ACID 4 MG/100ML IV SOLN
4.0000 mg | Freq: Once | INTRAVENOUS | Status: AC
  Administered 2022-10-28: 4 mg via INTRAVENOUS
  Filled 2022-10-28: qty 100

## 2022-10-28 MED ORDER — SODIUM CHLORIDE 0.9 % IV SOLN
INTRAVENOUS | Status: DC
  Filled 2022-10-28: qty 250

## 2022-10-28 NOTE — Progress Notes (Addendum)
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 Continue letrozole 2.5 mg, plan for total 5 years, -till 08/2024. 04/24/2022 screening mammogram was reviewed and discussed with patient.   05/21/2022 right diagnostic mammogram and ultrasound No mammographic evidence of malignancy.  Obtain annual bilateral screening mammogram in June 2024.  Osteopenia continue calcium and vitamin D supplementation.  Recommend exercise as tolerated Zometa 4 mg every 6 months.  Proceed with Zometa today 08/21/2021 Bone Density showed osteopenia, stable.  Plan DEXA every 2 years.-October 2024  Orders Placed This Encounter  Procedures   MM 3D SCREEN BREAST BILATERAL    Standing Status:   Future    Standing Expiration Date:   10/29/2023    Order Specific Question:   Reason for Exam (SYMPTOM  OR DIAGNOSIS REQUIRED)    Answer:   history of DCIS of Right breast    Order Specific Question:   Preferred imaging location?    Answer:   [facility]   CBC with Differential/Platelet    Standing Status:   Future    Standing Expiration Date:   10/29/2023   Comprehensive metabolic panel    Standing Status:   Future    Standing Expiration Date:   10/28/2023   Follow-up in 6 months. All questions were answered. The patient knows to call the clinic with any problems, questions or concerns.  Earlie Server, MD, PhD [facility] Hematology Oncology 10/28/2022   HISTORY OF PRESENTING ILLNESS:  Jane Gardner is a  66 y.o.  female with PMH listed below who was referred to me for  evaluation of DCIS  Patient had routine screening mammogram on 04/19/2019 which revealed calcification warrant further evaluation.   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  Estrogen receptor status, >90%  Nipple discharge: Denies Family history: Mother had history of breast cancer and bladder cancer.  Alive.  Maternal aunt has pancreatic cancer.  Maternal uncle has stomach cancer.  Maternal uncle has lung cancer. OCP use: Previous use of OCP Estrogen and progesterone therapy: Menopause in 62s, she was on hormone replacement for a few years. History of radiation to chest: denies.  No previous breast biopsy.  # 05/15/2019 patient underwent right lumpectomy with sentinel lymph node biopsy. Pathology showed high-grade DCIS, 29m, grade 3, posterior margin was positive for DCIS.  Regional lymph nodes uninvolved by tumor cells. pTis N0 05/24/2019 reexcision negative for DCIS. 08/09/2019 finished adjuvant radiation 08/23/2019 started on letrozole 2.5 mg   Family history of cancer, patient has family history of breast cancer, pancreatic cancer, stomach cancer. 7/6/2021[employer] genetic testing was negative 04/24/2022, bilateral screening mammogram showed asymmetry in the right breast.  Left breast no findings suspicious for malignancy. 04/24/2022, bilateral screening mammogram showed asymmetry in the right breast.  Left breast no findings suspicious for malignancy.  INTERVAL HISTORY Jane Gardner a 66y.o. female who has above history reviewed by me today presents  for follow up visit for management of DCIS Patient has been on Letrozole 2.'5mg'$  daily, patient tolerates well with manageable side effects.   Denies any new breast  concerns.      Review of Systems  Constitutional:  Negative for appetite change, chills, fatigue and fever.  HENT:   Negative for hearing loss and voice change.   Eyes:  Negative for eye problems.  Respiratory:  Negative for chest tightness and cough.   Cardiovascular:  Negative for chest pain.  Gastrointestinal:  Negative for abdominal distention, abdominal pain and blood in stool.  Endocrine: Positive for hot flashes.  Genitourinary:  Negative for difficulty urinating and frequency.   Musculoskeletal:  Positive for arthralgias.  Skin:  Negative for itching and rash.  Neurological:  Negative for extremity weakness.  Hematological:  Negative for adenopathy.  Psychiatric/Behavioral:  Negative for confusion.     MEDICAL HISTORY:  Past Medical History:  Diagnosis Date   Asthma    Breast cancer ([place])    Ductal carcinoma in situ (DCIS) of right breast 06/08/2019   Family history of breast cancer    Family history of colon cancer    Family history of lung cancer    Family history of pancreatic cancer    Family history of stomach cancer    Fibrocystic breast    GERD (gastroesophageal reflux disease)    Hyperlipemia    Hypertension    Osteopenia 08/23/2019   Personal history of radiation therapy    Pneumonia     SURGICAL HISTORY: Past Surgical History:  Procedure Laterality Date   BREAST BIOPSY Right 05/02/2019   right breast stereo bx of calcs, coil clip, path pending   BREAST CYST ASPIRATION     ? side neg   MASTECTOMY, PARTIAL Right 05/24/2019   Procedure: MASTECTOMY PARTIAL, REEXCISION OF RIGHT BREAST;  Surgeon: Herbert Pun, MD;  Location: [facility] ORS;  Service: General;  Laterality: Right;   PARTIAL MASTECTOMY WITH NEEDLE LOCALIZATION Right 05/15/2019   Procedure: PARTIAL MASTECTOMY WITH NEEDLE LOCALIZATION AND SENTINEL NODE, RIGHT;  Surgeon: Herbert Pun, MD;  Location: [facility] ORS;  Service: General;  Laterality: Right;   TONSILLECTOMY     TONSILLECTOMY  AND ADENOIDECTOMY     TUBAL LIGATION      SOCIAL HISTORY: Social History   Socioeconomic History   Marital status: Married    Spouse name: Not on file   Number of children: Not on file   Years of education: Not on file   Highest education level: Not on file  Occupational History   Not on file  Tobacco Use   Smoking status: Former    Packs/day: 1.00    Years: 3.00    Total pack years: 3.00    Types: Cigarettes    Quit date: 28    Years since quitting: 47.0   Smokeless tobacco: Never  Vaping Use   Vaping Use: Never used  Substance and Sexual Activity   Alcohol use: Yes    Comment: rare wine   Drug use: No   Sexual activity: Not on file  Other Topics Concern   Not on file  Social History Narrative   Not on file   Social Determinants of Health   Financial Resource Strain: Not on file  Food Insecurity: Not on file  Transportation Needs: Not on file  Physical Activity: Not on file  Stress: Not on file  Social Connections: Not on file  Intimate Partner Violence: Not on file    FAMILY HISTORY: Family History  Problem Relation Age of Onset   Hypertension Mother    Breast cancer Mother 17   Hyperlipidemia Mother    Bladder Cancer Mother 66   Diabetes Father    Pancreatic cancer Maternal Aunt        dx 63s   Stomach cancer Maternal Uncle        dx 53s   Diabetes Paternal Grandmother    Lung cancer Maternal Uncle        both dx 56s   Colon cancer Cousin        dx 30s-40s   Breast cancer Cousin        dx 61s in Hospice    ALLERGIES:  is allergic to hyoscyamine sulfate, latex, and other.  MEDICATIONS:  Current Outpatient Medications  Medication Sig Dispense Refill   albuterol (PROVENTIL HFA;VENTOLIN HFA) 108 (90 Base) MCG/ACT inhaler Inhale 1-2 puffs into the lungs every 6 (six) hours as needed for wheezing or shortness of breath. 1 Inhaler 0   Ascorbic Acid (VITAMIN C) 1000 MG tablet Take 1 tablet by mouth daily.     atorvastatin (LIPITOR) 40 MG tablet  Take 40 mg by mouth daily.     B Complex-C (SUPER B COMPLEX/VITAMIN C PO)      Biotin 5000 MCG CAPS Take by mouth.     Calcium Carbonate-Vit D-Min (CALCIUM 1200 PO) Take by mouth.     cetirizine (ZYRTEC) 10 MG tablet Take 10 mg by mouth daily.     Cholecalciferol (VITAMIN D3) 25 MCG (1000 UT) CAPS Take by mouth.     co-enzyme Q-10 50 MG capsule Take 100 mg by mouth daily.     Collagen-Vitamin C (COLLAGEN PLUS VITAMIN C PO)      ELDERBERRY PO Take by mouth.     famotidine (PEPCID) 20 MG tablet Take by mouth.     fluticasone (FLONASE) 50 MCG/ACT nasal spray Place 2 sprays into both nostrils daily. 16 g 2   letrozole (FEMARA) 2.5 MG tablet Take 1 tablet (2.5 mg total) by mouth daily. 90 tablet 2   lisinopril (ZESTRIL) 10 MG tablet Take 10 mg by mouth daily.     Misc Natural Products (GLUCOSAMINE CHONDROITIN ADV PO) Take by mouth.     No current facility-administered medications for this visit.     PHYSICAL EXAMINATION: ECOG PERFORMANCE STATUS: 0 - Asymptomatic Vitals:   10/28/22 1008  BP: 119/87  Pulse: 78  Resp: 18  Temp: 98 F (36.7 C)   Filed Weights   10/28/22 1008  Weight: 227 lb 14.4 oz (103.4 kg)    Physical Exam Constitutional:      General: She is not in acute distress. HENT:     Head: Normocephalic and atraumatic.  Eyes:     General: No scleral icterus.    Pupils: Pupils are equal, round, and reactive to light.  Cardiovascular:     Rate and Rhythm: Normal rate and regular rhythm.     Heart sounds: Normal heart sounds.  Pulmonary:     Effort: Pulmonary effort is normal. No respiratory distress.     Breath sounds: No wheezing.  Abdominal:     General: Bowel sounds are normal. There is no distension.     Palpations: Abdomen is soft. There is no mass.     Tenderness: There is no abdominal tenderness.  Musculoskeletal:        General: No deformity. Normal range of motion.     Cervical back: Normal range of motion and neck supple.  Comments: Left lower  extremity edema which is chronic  Skin:    General: Skin is warm and dry.     Findings: No erythema or rash.  Neurological:     Mental Status: She is alert and oriented to person, place, and time. Mental status is at baseline.     Cranial Nerves: No cranial nerve deficit.     Coordination: Coordination normal.  Psychiatric:        Mood and Affect: Mood normal.    Right breast status post lumpectomy and sentinel lymph node biopsy.  + Scar tissue at the site of lumpectomy. No other palpable discrete mass in bilateral breast. No palpable axillary lymphadenopathy   LABORATORY DATA:  I have reviewed the data as listed Lab Results  Component Value Date   WBC 5.7 10/28/2022   HGB 13.8 10/28/2022   HCT 41.0 10/28/2022   MCV 92.8 10/28/2022   PLT 280 10/28/2022   Recent Labs    10/28/22 0957  NA 141  K 4.1  CL 107  CO2 27  GLUCOSE 110*  BUN 19  CREATININE 0.88  CALCIUM 9.3  GFRNONAA >60  PROT 6.9  ALBUMIN 4.0  AST 25  ALT 24  ALKPHOS 71  BILITOT 0.7    RADIOGRAPHIC STUDIES: I have personally reviewed the radiological images as listed and agreed with the findings in the report. No results found.

## 2022-10-28 NOTE — Assessment & Plan Note (Addendum)
#  High-grade DCIS, ER positive, status post lumpectomy, and adjuvant radiation. Continue letrozole 2.5 mg, plan for total 5 years, -till 08/2024. 04/24/2022 screening mammogram was reviewed and discussed with patient.   05/21/2022 right diagnostic mammogram and ultrasound No mammographic evidence of malignancy.  Obtain annual bilateral screening mammogram in June 2024.

## 2022-10-28 NOTE — Assessment & Plan Note (Addendum)
continue calcium and vitamin D supplementation.  Recommend exercise as tolerated Zometa 4 mg every 6 months.  Proceed with Zometa today 08/21/2021 Bone Density showed osteopenia, stable.  Plan DEXA every 2 years.-October 2024

## 2022-10-28 NOTE — Progress Notes (Signed)
Pt here for follow up. No new concerns, no new breast problems.

## 2023-01-29 ENCOUNTER — Encounter: Payer: Self-pay | Admitting: Oncology

## 2023-01-30 ENCOUNTER — Other Ambulatory Visit: Payer: Self-pay | Admitting: Oncology

## 2023-04-26 ENCOUNTER — Ambulatory Visit
Admission: RE | Admit: 2023-04-26 | Discharge: 2023-04-26 | Disposition: A | Discharge status: Home / Self Care | Payer: Medicare HMO | Source: Ambulatory Visit | Attending: Oncology | Admitting: Oncology

## 2023-04-26 DIAGNOSIS — D0511 Intraductal carcinoma in situ of right breast: Secondary | ICD-10-CM | POA: Diagnosis present

## 2023-04-26 DIAGNOSIS — Z1231 Encounter for screening mammogram for malignant neoplasm of breast: Secondary | ICD-10-CM | POA: Insufficient documentation

## 2023-05-03 ENCOUNTER — Other Ambulatory Visit: Payer: Self-pay | Admitting: Oncology

## 2023-05-10 ENCOUNTER — Inpatient Hospital Stay (HOSPITAL_BASED_OUTPATIENT_CLINIC_OR_DEPARTMENT_OTHER): Payer: Medicare HMO | Admitting: Oncology

## 2023-05-10 ENCOUNTER — Inpatient Hospital Stay: Payer: Medicare HMO | Attending: Oncology

## 2023-05-10 ENCOUNTER — Encounter: Payer: Self-pay | Admitting: Oncology

## 2023-05-10 ENCOUNTER — Inpatient Hospital Stay: Payer: Medicare HMO

## 2023-05-10 VITALS — BP 121/68 | HR 58 | Temp 98.2°F | Resp 18

## 2023-05-10 VITALS — BP 128/84 | HR 63 | Temp 96.4°F | Wt 227.0 lb

## 2023-05-10 DIAGNOSIS — M8580 Other specified disorders of bone density and structure, unspecified site: Secondary | ICD-10-CM | POA: Diagnosis not present

## 2023-05-10 DIAGNOSIS — Z79811 Long term (current) use of aromatase inhibitors: Secondary | ICD-10-CM | POA: Diagnosis not present

## 2023-05-10 DIAGNOSIS — D0511 Intraductal carcinoma in situ of right breast: Secondary | ICD-10-CM | POA: Diagnosis present

## 2023-05-10 DIAGNOSIS — Z170 Estrogen receptor positive status [ER+]: Secondary | ICD-10-CM | POA: Diagnosis not present

## 2023-05-10 LAB — CBC WITH DIFFERENTIAL/PLATELET
Abs Immature Granulocytes: 0.02 10*3/uL (ref 0.00–0.07)
Basophils Absolute: 0 10*3/uL (ref 0.0–0.1)
Basophils Relative: 1 %
Eosinophils Absolute: 0.2 10*3/uL (ref 0.0–0.5)
Eosinophils Relative: 4 %
HCT: 40.7 % (ref 36.0–46.0)
Hemoglobin: 13.4 g/dL (ref 12.0–15.0)
Immature Granulocytes: 0 %
Lymphocytes Relative: 24 %
Lymphs Abs: 1.5 10*3/uL (ref 0.7–4.0)
MCH: 31 pg (ref 26.0–34.0)
MCHC: 32.9 g/dL (ref 30.0–36.0)
MCV: 94.2 fL (ref 80.0–100.0)
Monocytes Absolute: 0.5 10*3/uL (ref 0.1–1.0)
Monocytes Relative: 9 %
Neutro Abs: 3.9 10*3/uL (ref 1.7–7.7)
Neutrophils Relative %: 62 %
Platelets: 297 10*3/uL (ref 150–400)
RBC: 4.32 MIL/uL (ref 3.87–5.11)
RDW: 12.5 % (ref 11.5–15.5)
WBC: 6.1 10*3/uL (ref 4.0–10.5)
nRBC: 0 % (ref 0.0–0.2)

## 2023-05-10 LAB — COMPREHENSIVE METABOLIC PANEL
ALT: 25 U/L (ref 0–44)
AST: 22 U/L (ref 15–41)
Albumin: 4 g/dL (ref 3.5–5.0)
Alkaline Phosphatase: 77 U/L (ref 38–126)
Anion gap: 6 (ref 5–15)
BUN: 16 mg/dL (ref 8–23)
CO2: 29 mmol/L (ref 22–32)
Calcium: 9.6 mg/dL (ref 8.9–10.3)
Chloride: 104 mmol/L (ref 98–111)
Creatinine, Ser: 0.96 mg/dL (ref 0.44–1.00)
GFR, Estimated: >60 mL/min (ref >60)
Glucose, Bld: 106 mg/dL — ABNORMAL HIGH (ref 70–99)
Potassium: 4.2 mmol/L (ref 3.5–5.1)
Sodium: 139 mmol/L (ref 135–145)
Total Bilirubin: 0.8 mg/dL (ref 0.3–1.2)
Total Protein: 6.8 g/dL (ref 6.5–8.1)

## 2023-05-10 MED ORDER — ZOLEDRONIC ACID 4 MG/100ML IV SOLN
4.0000 mg | Freq: Once | INTRAVENOUS | Status: AC
  Administered 2023-05-10: 4 mg via INTRAVENOUS
  Filled 2023-05-10: qty 100

## 2023-05-10 MED ORDER — SODIUM CHLORIDE 0.9 % IV SOLN
INTRAVENOUS | Status: DC
  Filled 2023-05-10: qty 250

## 2023-05-10 NOTE — Assessment & Plan Note (Signed)
continue calcium and vitamin D supplementation.  Recommend exercise as tolerated Zometa 4 mg every 6 months.  Proceed with Zometa today 08/21/2021 Bone Density showed osteopenia, stable.  Plan DEXA every 2 years.-order next one end of 2024

## 2023-05-10 NOTE — Assessment & Plan Note (Addendum)
#  High-grade DCIS, ER positive, status post lumpectomy, and adjuvant radiation. Continue letrozole 2.5 mg, plan for total 5 years, -till 08/2024. June 2024 mammogram results were reviewed and discussed with patient.

## 2023-05-10 NOTE — Progress Notes (Signed)
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 Continue letrozole 2.5 mg, plan for total 5 years, -till 08/2024. June 2024 mammogram results were reviewed and discussed with patient.   Osteopenia continue calcium and vitamin D supplementation.  Recommend exercise as tolerated Zometa 4 mg every 6 months.  Proceed with Zometa today 08/21/2021 Bone Density showed osteopenia, stable.  Plan DEXA every 2 years.-order next one end of 2024  Orders Placed This Encounter  Procedures   DG Bone Density    Standing Status:   Future    Standing Expiration Date:   05/09/2024    Order Specific Question:   Reason for Exam (SYMPTOM  OR DIAGNOSIS REQUIRED)    Answer:   hx breast cancer, AI use    Order Specific Question:   Preferred imaging location?    Answer:   [facility]   CBC (Cancer Center Only)    Standing Status:   Future    Standing Expiration Date:   05/09/2024   CMP (Cancer Center only)    Standing Status:   Future    Standing Expiration Date:   05/09/2024   Follow-up in 6 months. All questions were answered. The patient knows to call the clinic with any problems, questions or concerns.  Rickard Patience, MD, PhD [facility] Hematology Oncology 05/10/2023   HISTORY OF PRESENTING ILLNESS:  Lyvonne Niswonger is a  67 y.o.  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   Patient underwent unilateral right diagnostic  mammogram on 04/28/2019 which showed a group of punctated calcification in linear distribution in the right breast upper outer quadrant, measures 2.3 x 0.4 x 0.8 cm Biopsy pathology showed: DCIS, nuclear grade 2-3, with expansile comedonecrosis and associated calcifications.  Estrogen receptor status, >90%  Nipple discharge: Denies Family history: Mother had history of breast cancer and bladder cancer.  Alive.  Maternal aunt has pancreatic cancer.  Maternal uncle has stomach cancer.  Maternal uncle has lung cancer. OCP use: Previous use of OCP Estrogen and progesterone therapy: Menopause in 40s, she was on hormone replacement for a few years. History of radiation to chest: denies.  No previous breast biopsy.  # 05/15/2019 patient underwent right lumpectomy with sentinel lymph node biopsy. Pathology showed high-grade DCIS, 16mm, grade 3, posterior margin was positive for DCIS.  Regional lymph nodes uninvolved by tumor cells. pTis N0 05/24/2019 reexcision negative for DCIS. 08/09/2019 finished adjuvant radiation 08/23/2019 started on letrozole 2.5 mg   Family history of cancer, patient has family history of breast cancer, pancreatic cancer, stomach cancer. 7/6/2021[employer] genetic testing was negative 04/24/2022, bilateral screening mammogram showed asymmetry in the right breast.  Left breast no findings suspicious for malignancy. 04/24/2022, bilateral screening mammogram showed asymmetry in the right breast.  Left breast no findings suspicious for malignancy.  05/21/2022 right diagnostic mammogram and ultrasound No mammographic evidence of malignancy.   INTERVAL HISTORY Janeth Saeteurn is a 67 y.o. female who has above history reviewed by me today presents for follow up visit  for management of DCIS Patient has been on Letrozole 2.5mg  daily, patient tolerates well with manageable side effects.  + hot flash.  Denies any new breast concerns.    Review of Systems  Constitutional:  Negative for  appetite change, chills, fatigue and fever.  HENT:   Negative for hearing loss and voice change.   Eyes:  Negative for eye problems.  Respiratory:  Negative for chest tightness and cough.   Cardiovascular:  Negative for chest pain.  Gastrointestinal:  Negative for abdominal distention, abdominal pain and blood in stool.  Endocrine: Positive for hot flashes.  Genitourinary:  Negative for difficulty urinating and frequency.   Musculoskeletal:  Positive for arthralgias.  Skin:  Negative for itching and rash.  Neurological:  Negative for extremity weakness.  Hematological:  Negative for adenopathy.  Psychiatric/Behavioral:  Negative for confusion.     MEDICAL HISTORY:  Past Medical History:  Diagnosis Date   Asthma    Breast cancer ([facility])    Ductal carcinoma in situ (DCIS) of right breast 06/08/2019   Family history of breast cancer    Family history of colon cancer    Family history of lung cancer    Family history of pancreatic cancer    Family history of stomach cancer    Fibrocystic breast    GERD (gastroesophageal reflux disease)    Hyperlipemia    Hypertension    Osteopenia 08/23/2019   Personal history of radiation therapy    Pneumonia     SURGICAL HISTORY: Past Surgical History:  Procedure Laterality Date   BREAST BIOPSY Right 05/02/2019   right breast stereo bx of calcs, coil clip, path pending   BREAST CYST ASPIRATION     ? side neg   MASTECTOMY, PARTIAL Right 05/24/2019   Procedure: MASTECTOMY PARTIAL, REEXCISION OF RIGHT BREAST;  Surgeon: Carolan Shiver, MD;  Location: [facility] ORS;  Service: General;  Laterality: Right;   PARTIAL MASTECTOMY WITH NEEDLE LOCALIZATION Right 05/15/2019   Procedure: PARTIAL MASTECTOMY WITH NEEDLE LOCALIZATION AND SENTINEL NODE, RIGHT;  Surgeon: Carolan Shiver, MD;  Location: [facility] ORS;  Service: General;  Laterality: Right;   TONSILLECTOMY     TONSILLECTOMY AND ADENOIDECTOMY     TUBAL LIGATION      SOCIAL HISTORY: Social  History   Socioeconomic History   Marital status: Married    Spouse name: Not on file   Number of children: Not on file   Years of education: Not on file   Highest education level: Not on file  Occupational History   Not on file  Tobacco Use   Smoking status: Former    Packs/day: 1.00    Years: 3.00    Additional pack years: 0.00    Total pack years: 3.00    Types: Cigarettes    Quit date: 20    Years since quitting: 47.5   Smokeless tobacco: Never  Vaping Use   Vaping Use: Never used  Substance and Sexual Activity   Alcohol use: Yes    Comment: rare wine   Drug use: No   Sexual activity: Not on file  Other Topics Concern   Not on file  Social History Narrative   Not on file   Social Determinants of Health   Financial Resource Strain: Not on file  Food Insecurity: Not on file  Transportation Needs: Not on file  Physical Activity: Not on file  Stress: Not on file  Social Connections: Not on file  Intimate Partner Violence: Not on file  FAMILY HISTORY: Family History  Problem Relation Age of Onset   Hypertension Mother    Breast cancer Mother 72   Hyperlipidemia Mother    Bladder Cancer Mother 16   Diabetes Father    Pancreatic cancer Maternal Aunt        dx 82s   Stomach cancer Maternal Uncle        dx 51s   Diabetes Paternal Grandmother    Lung cancer Maternal Uncle        both dx 55s   Colon cancer Cousin        dx 30s-40s   Breast cancer Cousin        dx 63s in Hospice    ALLERGIES:  is allergic to hyoscyamine sulfate, latex, and other.  MEDICATIONS:  Current Outpatient Medications  Medication Sig Dispense Refill   albuterol (PROVENTIL HFA;VENTOLIN HFA) 108 (90 Base) MCG/ACT inhaler Inhale 1-2 puffs into the lungs every 6 (six) hours as needed for wheezing or shortness of breath. 1 Inhaler 0   Ascorbic Acid (VITAMIN C) 1000 MG tablet Take 1 tablet by mouth daily.     atorvastatin (LIPITOR) 40 MG tablet Take 40 mg by mouth daily.     B  Complex-C (SUPER B COMPLEX/VITAMIN C PO)      Biotin 5000 MCG CAPS Take by mouth.     Calcium Carbonate-Vit D-Min (CALCIUM 1200 PO) Take by mouth.     cetirizine (ZYRTEC) 10 MG tablet Take 10 mg by mouth daily.     Cholecalciferol (VITAMIN D3) 25 MCG (1000 UT) CAPS Take by mouth.     co-enzyme Q-10 50 MG capsule Take 100 mg by mouth daily.     Collagen-Vitamin C (COLLAGEN PLUS VITAMIN C PO)      ELDERBERRY PO Take by mouth.     famotidine (PEPCID) 20 MG tablet Take by mouth.     fluticasone (FLONASE) 50 MCG/ACT nasal spray Place 2 sprays into both nostrils daily. 16 g 2   letrozole (FEMARA) 2.5 MG tablet Take 1 tablet by mouth once daily 90 tablet 0   lisinopril (ZESTRIL) 10 MG tablet Take 10 mg by mouth daily.     Misc Natural Products (GLUCOSAMINE CHONDROITIN ADV PO) Take by mouth.     No current facility-administered medications for this visit.   Facility-Administered Medications Ordered in Other Visits  Medication Dose Route Frequency Provider Last Rate Last Admin   0.9 %  sodium chloride infusion   Intravenous Continuous Rickard Patience, MD   Stopped at 05/10/23 1417     PHYSICAL EXAMINATION: ECOG PERFORMANCE STATUS: 0 - Asymptomatic Vitals:   05/10/23 1311  BP: 128/84  Pulse: 63  Temp: (!) 96.4 F (35.8 C)  SpO2: 99%   Filed Weights   05/10/23 1311  Weight: 227 lb (103 kg)    Physical Exam Constitutional:      General: She is not in acute distress. HENT:     Head: Normocephalic and atraumatic.  Eyes:     General: No scleral icterus. Cardiovascular:     Rate and Rhythm: Normal rate.  Pulmonary:     Effort: Pulmonary effort is normal. No respiratory distress.     Breath sounds: No wheezing.  Abdominal:     General: Bowel sounds are normal. There is no distension.     Palpations: Abdomen is soft. There is no mass.     Tenderness: There is no abdominal tenderness.  Musculoskeletal:        General: Normal range of  motion.     Cervical back: Normal range of motion and  neck supple.     Comments: Left lower extremity edema which is chronic  Skin:    General: Skin is warm.     Findings: No rash.  Neurological:     Mental Status: She is alert and oriented to person, place, and time. Mental status is at baseline.     Cranial Nerves: No cranial nerve deficit.  Psychiatric:        Mood and Affect: Mood normal.      LABORATORY DATA:  I have reviewed the data as listed Lab Results  Component Value Date   WBC 6.1 05/10/2023   HGB 13.4 05/10/2023   HCT 40.7 05/10/2023   MCV 94.2 05/10/2023   PLT 297 05/10/2023   Recent Labs    10/28/22 0957 05/10/23 1245  NA 141 139  K 4.1 4.2  CL 107 104  CO2 27 29  GLUCOSE 110* 106*  BUN 19 16  CREATININE 0.88 0.96  CALCIUM 9.3 9.6  GFRNONAA >60 >60  PROT 6.9 6.8  ALBUMIN 4.0 4.0  AST 25 22  ALT 24 25  ALKPHOS 71 77  BILITOT 0.7 0.8    RADIOGRAPHIC STUDIES: I have personally reviewed the radiological images as listed and agreed with the findings in the report. MM 3D SCREEN BREAST BILATERAL  Result Date: 04/28/2023 CLINICAL DATA:  Screening. EXAM: DIGITAL SCREENING BILATERAL MAMMOGRAM WITH TOMOSYNTHESIS AND CAD TECHNIQUE: Bilateral screening digital craniocaudal and mediolateral oblique mammograms were obtained. Bilateral screening digital breast tomosynthesis was performed. The images were evaluated with computer-aided detection. COMPARISON:  Previous exam(s). ACR Breast Density Category b: There are scattered areas of fibroglandular density. FINDINGS: There are no findings suspicious for malignancy. IMPRESSION: No mammographic evidence of malignancy. A result letter of this screening mammogram will be mailed directly to the patient. RECOMMENDATION: Screening mammogram in one year. (Code:SM-B-01Y) BI-RADS CATEGORY  1: Negative. Electronically Signed   By: Edwin Cap M.D.   On: 04/28/2023 08:27

## 2023-07-23 ENCOUNTER — Other Ambulatory Visit: Payer: Self-pay | Admitting: Oncology

## 2023-10-04 ENCOUNTER — Ambulatory Visit
Admission: RE | Admit: 2023-10-04 | Discharge: 2023-10-04 | Disposition: A | Discharge status: Home / Self Care | Payer: Medicare HMO | Source: Ambulatory Visit | Attending: Oncology | Admitting: Oncology

## 2023-10-04 DIAGNOSIS — D0511 Intraductal carcinoma in situ of right breast: Secondary | ICD-10-CM | POA: Diagnosis present

## 2023-10-04 DIAGNOSIS — Z79811 Long term (current) use of aromatase inhibitors: Secondary | ICD-10-CM | POA: Insufficient documentation

## 2023-10-20 ENCOUNTER — Other Ambulatory Visit: Payer: Self-pay | Admitting: Oncology

## 2023-11-10 ENCOUNTER — Encounter: Payer: Self-pay | Admitting: Oncology

## 2023-11-10 ENCOUNTER — Other Ambulatory Visit: Payer: Self-pay

## 2023-11-10 ENCOUNTER — Inpatient Hospital Stay: Payer: Medicare HMO | Attending: Oncology

## 2023-11-10 ENCOUNTER — Inpatient Hospital Stay: Payer: Medicare HMO

## 2023-11-10 ENCOUNTER — Inpatient Hospital Stay: Payer: Medicare HMO | Admitting: Oncology

## 2023-11-10 VITALS — BP 137/83 | HR 76 | Temp 97.4°F | Wt 221.4 lb

## 2023-11-10 VITALS — BP 134/74 | HR 64 | Temp 98.4°F | Resp 19

## 2023-11-10 DIAGNOSIS — Z170 Estrogen receptor positive status [ER+]: Secondary | ICD-10-CM | POA: Insufficient documentation

## 2023-11-10 DIAGNOSIS — D0511 Intraductal carcinoma in situ of right breast: Secondary | ICD-10-CM

## 2023-11-10 DIAGNOSIS — Z801 Family history of malignant neoplasm of trachea, bronchus and lung: Secondary | ICD-10-CM | POA: Diagnosis not present

## 2023-11-10 DIAGNOSIS — Z79811 Long term (current) use of aromatase inhibitors: Secondary | ICD-10-CM | POA: Insufficient documentation

## 2023-11-10 DIAGNOSIS — M8580 Other specified disorders of bone density and structure, unspecified site: Secondary | ICD-10-CM | POA: Insufficient documentation

## 2023-11-10 DIAGNOSIS — D72819 Decreased white blood cell count, unspecified: Secondary | ICD-10-CM | POA: Insufficient documentation

## 2023-11-10 DIAGNOSIS — D703 Neutropenia due to infection: Secondary | ICD-10-CM | POA: Diagnosis not present

## 2023-11-10 LAB — CMP (CANCER CENTER ONLY)
ALT: 35 U/L (ref 0–44)
AST: 38 U/L (ref 15–41)
Albumin: 3.7 g/dL (ref 3.5–5.0)
Alkaline Phosphatase: 59 U/L (ref 38–126)
Anion gap: 7 (ref 5–15)
BUN: 15 mg/dL (ref 8–23)
CO2: 27 mmol/L (ref 22–32)
Calcium: 9.1 mg/dL (ref 8.9–10.3)
Chloride: 107 mmol/L (ref 98–111)
Creatinine: 0.72 mg/dL (ref 0.44–1.00)
GFR, Estimated: >60 mL/min (ref >60)
Glucose, Bld: 103 mg/dL — ABNORMAL HIGH (ref 70–99)
Potassium: 3.5 mmol/L (ref 3.5–5.1)
Sodium: 141 mmol/L (ref 135–145)
Total Bilirubin: 0.9 mg/dL (ref 0.0–1.2)
Total Protein: 6.5 g/dL (ref 6.5–8.1)

## 2023-11-10 LAB — CBC (CANCER CENTER ONLY)
HCT: 38.9 % (ref 36.0–46.0)
Hemoglobin: 13 g/dL (ref 12.0–15.0)
MCH: 31.2 pg (ref 26.0–34.0)
MCHC: 33.4 g/dL (ref 30.0–36.0)
MCV: 93.3 fL (ref 80.0–100.0)
Platelet Count: 189 10*3/uL (ref 150–400)
RBC: 4.17 MIL/uL (ref 3.87–5.11)
RDW: 12.3 % (ref 11.5–15.5)
WBC Count: 3.1 10*3/uL — ABNORMAL LOW (ref 4.0–10.5)
nRBC: 0 % (ref 0.0–0.2)

## 2023-11-10 LAB — DIFFERENTIAL
Abs Immature Granulocytes: 0.01 10*3/uL (ref 0.00–0.07)
Basophils Absolute: 0 10*3/uL (ref 0.0–0.1)
Basophils Relative: 0 %
Eosinophils Absolute: 0.1 10*3/uL (ref 0.0–0.5)
Eosinophils Relative: 2 %
Immature Granulocytes: 0 %
Lymphocytes Relative: 54 %
Lymphs Abs: 1.7 10*3/uL (ref 0.7–4.0)
Monocytes Absolute: 0.3 10*3/uL (ref 0.1–1.0)
Monocytes Relative: 10 %
Neutro Abs: 1.1 10*3/uL — ABNORMAL LOW (ref 1.7–7.7)
Neutrophils Relative %: 34 %

## 2023-11-10 MED ORDER — LETROZOLE 2.5 MG PO TABS: 2.5000 mg | ORAL_TABLET | Freq: Every day | ORAL | 1 refills | Status: DC

## 2023-11-10 MED ORDER — ZOLEDRONIC ACID 4 MG/100ML IV SOLN
4.0000 mg | Freq: Once | INTRAVENOUS | Status: AC
  Administered 2023-11-10: 4 mg via INTRAVENOUS
  Filled 2023-11-10: qty 100

## 2023-11-10 NOTE — Assessment & Plan Note (Addendum)
 ANC 1.1, likely due to upper respiratory infection.  Observation.  Repeat cbc in 1 month

## 2023-11-10 NOTE — Assessment & Plan Note (Addendum)
 continue calcium and vitamin D supplementation.  Recommend exercise as tolerated Zometa 4 mg every 6 months.  Proceed with Zometa today 08/21/2021 Bone Density showed osteopenia, stable.  10/04/23 DEXA osteopenia

## 2023-11-10 NOTE — Assessment & Plan Note (Addendum)
#  High-grade DCIS, ER positive, status post lumpectomy, and adjuvant radiation. Continue letrozole 2.5 mg, plan for total 5 years, -till 08/2024. Annual mammogram - obtain June 2025

## 2023-11-10 NOTE — Addendum Note (Signed)
 Addended by: Rickard Patience on: 11/10/2023 07:26 PM   Modules accepted: Orders

## 2023-11-10 NOTE — Progress Notes (Signed)
 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 Continue letrozole  2.5 mg, plan for total 5 years, -till 08/2024. Annual mammogram - obtain June 2025  Osteopenia continue calcium and vitamin D supplementation.  Recommend exercise as tolerated Zometa  4 mg every 6 months.  Proceed with Zometa  today 08/21/2021 Bone Density showed osteopenia, stable.  10/04/23 DEXA osteopenia   Leukopenia ANC 1.1, likely due to upper respiratory infection.  Observation.  Repeat cbc in 1 month  Orders Placed This Encounter  Procedures   MM 3D SCREENING MAMMOGRAM BILATERAL BREAST    Standing Status:   Future    Expected Date:   04/09/2024    Expiration Date:   11/09/2024    Reason for Exam (SYMPTOM  OR DIAGNOSIS REQUIRED):   Screening mammo    Preferred imaging location?:   [facility]   CBC with Differential (Cancer Center Only)    Standing Status:   Future    Expected Date:   05/09/2024    Expiration Date:   11/09/2024   CMP (Cancer Center only)    Standing Status:   Future    Expected Date:   05/09/2024    Expiration Date:   11/09/2024   Differential    Standing Status:   Future    Number of Occurrences:   1    Expected Date:   11/10/2023    Expiration Date:   11/09/2024   Follow-up in 6 months. All questions were answered. The patient knows to call the clinic with any problems, questions or concerns.  Call Babara, MD, PhD [facility] Hematology Oncology 11/10/2023   HISTORY OF PRESENTING ILLNESS:  Jane Gardner is a  68 y.o.  female with PMH listed below who  was referred to me for evaluation of DCIS  Patient had routine screening mammogram on 04/19/2019 which revealed calcification warrant further evaluation.   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  Estrogen receptor status, >90%  Nipple discharge: Denies Family history: Mother had history of breast cancer and bladder cancer.  Alive.  Maternal aunt has pancreatic cancer.  Maternal uncle has stomach cancer.  Maternal uncle has lung cancer. OCP use: Previous use of OCP Estrogen and progesterone therapy: Menopause in 40s, she was on hormone replacement for a few years. History of radiation to chest: denies.  No previous breast biopsy.  # 05/15/2019 patient underwent right lumpectomy with sentinel lymph node biopsy. Pathology showed high-grade DCIS, 16mm, grade 3, posterior margin was positive for DCIS.  Regional lymph nodes uninvolved by tumor cells. pTis N0 05/24/2019 reexcision negative for DCIS. 08/09/2019 finished adjuvant radiation 08/23/2019 started on letrozole  2.5 mg   Family history of cancer, patient has family history of breast cancer, pancreatic cancer, stomach cancer. 7/6/2021[employer] genetic testing was negative 04/24/2022, bilateral screening mammogram showed asymmetry in the right breast.  Left breast no findings suspicious for malignancy. 04/24/2022, bilateral screening mammogram showed asymmetry  in the right breast.  Left breast no findings suspicious for malignancy.  05/21/2022 right diagnostic mammogram and ultrasound No mammographic evidence of malignancy.   INTERVAL HISTORY Jane Gardner is a 68 y.o. female who has above history reviewed by me today presents for follow up visit for management of  DCIS Patient has been on Letrozole  2.5mg  daily patient tolerates well with manageable side effects.  + hot flash.  Denies any new breast concerns.  Recent URI    Review of Systems  Constitutional:  Negative for appetite change, chills, fatigue and fever.  HENT:   Negative for hearing loss and voice change.   Eyes:  Negative for eye problems.  Respiratory:  Negative for chest tightness and cough.   Cardiovascular:  Negative for chest pain.  Gastrointestinal:  Negative for abdominal distention, abdominal pain and blood in stool.  Endocrine: Positive for hot flashes.  Genitourinary:  Negative for difficulty urinating and frequency.   Musculoskeletal:  Positive for arthralgias.  Skin:  Negative for itching and rash.  Neurological:  Negative for extremity weakness.  Hematological:  Negative for adenopathy.  Psychiatric/Behavioral:  Negative for confusion.     MEDICAL HISTORY:  Past Medical History:  Diagnosis Date   Asthma    Breast cancer (HCC)    Ductal carcinoma in situ (DCIS) of right breast 06/08/2019   Family history of breast cancer    Family history of colon cancer    Family history of lung cancer    Family history of pancreatic cancer    Family history of stomach cancer    Fibrocystic breast    GERD (gastroesophageal reflux disease)    Hyperlipemia    Hypertension    Osteopenia 08/23/2019   Personal history of radiation therapy    Pneumonia     SURGICAL HISTORY: Past Surgical History:  Procedure Laterality Date   BREAST BIOPSY Right 05/02/2019   right breast stereo bx of calcs, coil clip, path pending   BREAST CYST ASPIRATION     ? side neg   MASTECTOMY, PARTIAL Right 05/24/2019   Procedure: MASTECTOMY PARTIAL, REEXCISION OF RIGHT BREAST;  Surgeon: Rodolph Romano, MD;  Location: [facility] ORS;  Service: General;  Laterality: Right;   PARTIAL MASTECTOMY WITH NEEDLE LOCALIZATION Right 05/15/2019   Procedure: PARTIAL MASTECTOMY WITH NEEDLE LOCALIZATION AND  SENTINEL NODE, RIGHT;  Surgeon: Rodolph Romano, MD;  Location: [facility] ORS;  Service: General;  Laterality: Right;   TONSILLECTOMY     TONSILLECTOMY AND ADENOIDECTOMY     TUBAL LIGATION      SOCIAL HISTORY: Social History   Socioeconomic History   Marital status: Married    Spouse name: Not on file   Number of children: Not on file   Years of education: Not on file   Highest education level: Not on file  Occupational History   Not on file  Tobacco Use   Smoking status: Former    Current packs/day: 0.00    Average packs/day: 1 pack/day for 3.0 years (3.0 ttl pk-yrs)    Types: Cigarettes    Start date: 99    Quit date: 80    Years since quitting: 48.0   Smokeless tobacco: Never  Vaping Use   Vaping status: Never Used  Substance and Sexual Activity   Alcohol use: Yes    Comment: rare wine   Drug use: No   Sexual activity: Not on file  Other Topics Concern   Not on file  Social History Narrative   Not on file  Social Drivers of Corporate Investment Banker Strain: Low Risk  (11/07/2023)   Received [facility]   Overall Financial Resource Strain (CARDIA)    Difficulty of Paying Living Expenses: Not hard at all  Food Insecurity: No Food Insecurity (11/07/2023)   Received from [facility]   Hunger Vital Sign    Worried About Running Out of Food in the Last Year: Never true    Ran Out of Food in the Last Year: Never true  Transportation Needs: No Transportation Needs (11/07/2023)   [facility] - Transportation    In the past 12 months, has lack of transportation kept you from medical appointments or from getting medications?: No    Lack of Transportation (Non-Medical): No  Physical Activity: Not on file  Stress: Not on file  Social Connections: Not on file  Intimate Partner Violence: Not on file    FAMILY HISTORY: Family History  Problem Relation Age of Onset   Hypertension  Mother    Breast cancer Mother 82   Hyperlipidemia Mother    Bladder Cancer Mother 71   Rectal cancer Mother    Diabetes Father    Diabetes Paternal Grandmother    Pancreatic cancer Maternal Aunt        dx 9s   Stomach cancer Maternal Uncle        dx 50s   Lung cancer Maternal Uncle        both dx 53s   Colon cancer Cousin        dx 30s-40s   Breast cancer Cousin        dx 37s in Hospice    ALLERGIES:  is allergic to hyoscyamine sulfate, latex, and other.  MEDICATIONS:  Current Outpatient Medications  Medication Sig Dispense Refill   albuterol  (PROVENTIL  HFA;VENTOLIN  HFA) 108 (90 Base) MCG/ACT inhaler Inhale 1-2 puffs into the lungs every 6 (six) hours as needed for wheezing or shortness of breath. 1 Inhaler 0   Ascorbic Acid (VITAMIN C) 1000 MG tablet Take 1 tablet by mouth daily.     atorvastatin  (LIPITOR) 40 MG tablet Take 40 mg by mouth daily.     B Complex-C (SUPER B COMPLEX/VITAMIN C PO)      Biotin 5000 MCG CAPS Take by mouth.     Calcium Carbonate-Vit D-Min (CALCIUM 1200 PO) Take by mouth.     cetirizine (ZYRTEC) 10 MG tablet Take 10 mg by mouth daily.     Cholecalciferol (VITAMIN D3) 25 MCG (1000 UT) CAPS Take by mouth.     co-enzyme Q-10 50 MG capsule Take 100 mg by mouth daily.     Collagen-Vitamin C (COLLAGEN PLUS VITAMIN C PO)      ELDERBERRY PO Take by mouth.     famotidine  (PEPCID ) 20 MG tablet Take by mouth.     fluticasone  (FLONASE ) 50 MCG/ACT nasal spray Place 2 sprays into both nostrils daily. 16 g 2   lisinopril  (ZESTRIL ) 10 MG tablet Take 10 mg by mouth daily.     Misc Natural Products (GLUCOSAMINE CHONDROITIN ADV PO) Take by mouth.     letrozole  (FEMARA ) 2.5 MG tablet Take 1 tablet (2.5 mg total) by mouth daily. 90 tablet 1   No current facility-administered medications for this visit.     PHYSICAL EXAMINATION: ECOG PERFORMANCE STATUS: 0 - Asymptomatic Vitals:   11/10/23 1332  BP: 137/83  Pulse: 76  Temp: (!) 97.4 F (36.3 C)  SpO2:  99%    Filed Weights   11/10/23 1332  Weight: 221 lb 6.4 oz (100.4 kg)    Physical Exam Constitutional:      General: She is not in acute distress. HENT:     Head: Normocephalic and atraumatic.  Eyes:     General: No scleral icterus. Cardiovascular:     Rate and Rhythm: Normal rate.  Pulmonary:     Effort: Pulmonary effort is normal. No respiratory distress.     Breath sounds: No wheezing.  Abdominal:     General: Bowel sounds are normal. There is no distension.     Palpations: Abdomen is soft. There is no mass.     Tenderness: There is no abdominal tenderness.  Musculoskeletal:        General: Normal range of motion.     Cervical back: Normal range of motion and neck supple.     Comments: Left lower extremity edema which is chronic  Skin:    General: Skin is warm.     Findings: No rash.  Neurological:     Mental Status: She is alert and oriented to person, place, and time. Mental status is at baseline.     Cranial Nerves: No cranial nerve deficit.  Psychiatric:        Mood and Affect: Mood normal.      LABORATORY DATA:  I have reviewed the data as listed Lab Results  Component Value Date   WBC 3.1 (L) 11/10/2023   HGB 13.0 11/10/2023   HCT 38.9 11/10/2023   MCV 93.3 11/10/2023   PLT 189 11/10/2023   Recent Labs    05/10/23 1245 11/10/23 1317  NA 139 141  K 4.2 3.5  CL 104 107  CO2 29 27  GLUCOSE 106* 103*  BUN 16 15  CREATININE 0.96 0.72  CALCIUM 9.6 9.1  GFRNONAA >60 >60  PROT 6.8 6.5  ALBUMIN 4.0 3.7  AST 22 38  ALT 25 35  ALKPHOS 77 59  BILITOT 0.8 0.9   RADIOGRAPHIC STUDIES: I have personally reviewed the radiological images as listed and agreed with the findings in the report. DG Bone Density Result Date: 10/04/2023 EXAM: DUAL X-RAY ABSORPTIOMETRY (DXA) FOR BONE MINERAL DENSITY IMPRESSION: Your patient Jane Gardner completed a BMD test on 10/04/2023 using the [employer] iDXA DXA System (software version: 14.10) manufactured by [employer]. The following summarizes the results of our evaluation. Technologist: SCE PATIENT BIOGRAPHICAL: Name: Jane, Gardner Patient ID: [id number] Birth Date: 06-Aug-1956 Height: 63.0 in. Gender: Female Exam Date: 10/04/2023 Weight: 227.4 lbs. Indications: Asthma, Caucasian, Height Loss, History of Breast Cancer, History of Fracture (Adult), History of Radiation, Postmenopausal Fractures: foot Treatments: Calcium, Femara , Vitamin D, Zometa  DENSITOMETRY RESULTS: Site      Region     Measured Date Measured Age WHO Classification Young Adult T-score BMD         %Change vs. Previous Significant Change (*) AP Spine L1-L3 8/04/[age] 69.6 Normal -0.5 1.118 g/cm2 2.9% Yes AP Spine L1-L3 6/21/[age] 67.5 Normal -0.8 1.086 g/cm2 - - DualFemur Neck Right 10/04/2023 69.6 Osteopenia -2.1 0.743 g/cm2 1.4% - DualFemur Neck Right 08/21/2021 67.5 Osteopenia -2.2 0.733 g/cm2 2.2% - DualFemur Neck Right 6/21/2019 65.5 Osteopenia -2.3 0.717 g/cm2 - - DualFemur Total Mean 10/04/2023 69.6 Normal -0.8 0.906 g/cm2 4.0% Yes DualFemur Total Mean 08/21/2021 67.5 Osteopenia -1.1 0.871 g/cm2 2.5% Yes DualFemur Total Mean 08/21/2019 65.5 Osteopenia -1.3 0.850 g/cm2 - - ASSESSMENT: The BMD measured at Femur Neck Right is 0.743 g/cm2  with a T-score of -2.1. This patient is considered osteopenic according to World Health Organization [facility]) criteria. The scan quality is good. L4 was excluded due to degenerative changes. Patient is not a candidate for FRAX due to Zometa . Compared with prior study, there has been a significant increase in the spine. Compared with prior study, there has been a significant increase in the total hip. [employer] [facility]) criteria for post-menopausal, Caucasian Women: Normal:                   T-score at or above -1 SD Osteopenia/low bone mass: T-score between -1 and -2.5 SD Osteoporosis:             T-score at or below -2.5 SD RECOMMENDATIONS: 1. All patients should optimize calcium and vitamin D intake. 2.  Consider FDA-approved medical therapies in postmenopausal women and men aged 38 years and older, based on the following: a. A hip or vertebral(clinical or morphometric) fracture b. T-score < -2.5 at the femoral neck or spine after appropriate evaluation to exclude secondary causes c. Low bone mass (T-score between -1.0 and -2.5 at the femoral neck or spine) and a 10-year probability of a hip fracture > 3% or a 10-year probability of a major osteoporosis-related fracture > 20% based on the [place] -adapted WHO algorithm 3. Clinician judgment and/or patient preferences may indicate treatment for people with 10-year fracture probabilities above or below these levels FOLLOW-UP: People with diagnosed cases of osteoporosis or at high risk for fracture should have regular bone mineral density tests. For patients eligible for Medicare, routine testing is allowed once every 2 years. The testing frequency can be increased to one year for patients who have rapidly progressing disease, those who are receiving or discontinuing medical therapy to restore bone mass, or have additional risk factors. I have reviewed this report, and agree with the above findings. [facility] Electronically Signed   By: Norman Hopper M.D.   On: 10/04/2023 13:20

## 2023-11-11 ENCOUNTER — Telehealth: Payer: Self-pay

## 2023-11-11 ENCOUNTER — Other Ambulatory Visit: Payer: Self-pay

## 2023-11-11 DIAGNOSIS — D0511 Intraductal carcinoma in situ of right breast: Secondary | ICD-10-CM

## 2023-11-11 NOTE — Telephone Encounter (Signed)
-----   Message from Rickard Patience sent at 11/10/2023  7:21 PM EST ----- Please let her know that white count is slightly decreased, likely due to infection.  Please arrange her to repeat cbc in 1 month thanks.

## 2023-11-11 NOTE — Telephone Encounter (Addendum)
 Called patient and informed her that her WBC is slightly decreased, likely due to infection and Dr. Cathie Hoops recommends repeating in 1 month. I informed patient that I will have Morrie Sheldon schedule one month lab only and contact her with date and time.

## 2023-12-04 DIAGNOSIS — Z789 Other specified health status: Secondary | ICD-10-CM | POA: Insufficient documentation

## 2023-12-14 ENCOUNTER — Inpatient Hospital Stay: Payer: Medicare HMO | Attending: Oncology

## 2023-12-14 ENCOUNTER — Other Ambulatory Visit: Payer: Medicare HMO

## 2023-12-14 DIAGNOSIS — D0511 Intraductal carcinoma in situ of right breast: Secondary | ICD-10-CM | POA: Diagnosis present

## 2023-12-14 LAB — CBC WITH DIFFERENTIAL (CANCER CENTER ONLY)
Abs Immature Granulocytes: 0.02 10*3/uL (ref 0.00–0.07)
Basophils Absolute: 0 10*3/uL (ref 0.0–0.1)
Basophils Relative: 1 %
Eosinophils Absolute: 0.1 10*3/uL (ref 0.0–0.5)
Eosinophils Relative: 3 %
HCT: 39.6 % (ref 36.0–46.0)
Hemoglobin: 13.2 g/dL (ref 12.0–15.0)
Immature Granulocytes: 0 %
Lymphocytes Relative: 31 %
Lymphs Abs: 1.8 10*3/uL (ref 0.7–4.0)
MCH: 31.3 pg (ref 26.0–34.0)
MCHC: 33.3 g/dL (ref 30.0–36.0)
MCV: 93.8 fL (ref 80.0–100.0)
Monocytes Absolute: 0.6 10*3/uL (ref 0.1–1.0)
Monocytes Relative: 10 %
Neutro Abs: 3.2 10*3/uL (ref 1.7–7.7)
Neutrophils Relative %: 55 %
Platelet Count: 245 10*3/uL (ref 150–400)
RBC: 4.22 MIL/uL (ref 3.87–5.11)
RDW: 12.9 % (ref 11.5–15.5)
WBC Count: 5.7 10*3/uL (ref 4.0–10.5)
nRBC: 0 % (ref 0.0–0.2)

## 2023-12-18 ENCOUNTER — Encounter: Payer: Self-pay | Admitting: Oncology

## 2024-04-26 ENCOUNTER — Ambulatory Visit
Admission: RE | Admit: 2024-04-26 | Discharge: 2024-04-26 | Disposition: A | Discharge status: Home / Self Care | Source: Ambulatory Visit | Attending: Oncology | Admitting: Oncology

## 2024-04-26 DIAGNOSIS — Z86000 Personal history of in-situ neoplasm of breast: Secondary | ICD-10-CM | POA: Insufficient documentation

## 2024-04-26 DIAGNOSIS — Z1231 Encounter for screening mammogram for malignant neoplasm of breast: Secondary | ICD-10-CM | POA: Diagnosis present

## 2024-04-26 DIAGNOSIS — R921 Mammographic calcification found on diagnostic imaging of breast: Secondary | ICD-10-CM | POA: Diagnosis not present

## 2024-04-26 DIAGNOSIS — D0511 Intraductal carcinoma in situ of right breast: Secondary | ICD-10-CM

## 2024-04-28 ENCOUNTER — Other Ambulatory Visit: Payer: Self-pay | Admitting: Oncology

## 2024-04-28 DIAGNOSIS — R928 Other abnormal and inconclusive findings on diagnostic imaging of breast: Secondary | ICD-10-CM

## 2024-05-03 ENCOUNTER — Inpatient Hospital Stay
Admission: RE | Admit: 2024-05-03 | Discharge: 2024-05-03 | Discharge status: Home / Self Care | Source: Ambulatory Visit | Attending: Oncology

## 2024-05-03 ENCOUNTER — Ambulatory Visit
Admission: RE | Admit: 2024-05-03 | Discharge: 2024-05-03 | Disposition: A | Discharge status: Home / Self Care | Source: Ambulatory Visit | Attending: Oncology | Admitting: Oncology

## 2024-05-03 DIAGNOSIS — R928 Other abnormal and inconclusive findings on diagnostic imaging of breast: Secondary | ICD-10-CM

## 2024-05-09 ENCOUNTER — Inpatient Hospital Stay: Payer: Medicare HMO | Admitting: Oncology

## 2024-05-09 ENCOUNTER — Inpatient Hospital Stay: Payer: Medicare HMO

## 2024-05-09 ENCOUNTER — Encounter: Payer: Self-pay | Admitting: Oncology

## 2024-05-09 ENCOUNTER — Inpatient Hospital Stay: Payer: Medicare HMO | Attending: Oncology

## 2024-05-09 VITALS — BP 138/97 | HR 68 | Temp 97.5°F | Resp 18 | Wt 217.9 lb

## 2024-05-09 DIAGNOSIS — Z170 Estrogen receptor positive status [ER+]: Secondary | ICD-10-CM | POA: Insufficient documentation

## 2024-05-09 DIAGNOSIS — R7989 Other specified abnormal findings of blood chemistry: Secondary | ICD-10-CM | POA: Diagnosis not present

## 2024-05-09 DIAGNOSIS — M8580 Other specified disorders of bone density and structure, unspecified site: Secondary | ICD-10-CM

## 2024-05-09 DIAGNOSIS — D0511 Intraductal carcinoma in situ of right breast: Secondary | ICD-10-CM | POA: Insufficient documentation

## 2024-05-09 DIAGNOSIS — Z79811 Long term (current) use of aromatase inhibitors: Secondary | ICD-10-CM | POA: Diagnosis not present

## 2024-05-09 DIAGNOSIS — R928 Other abnormal and inconclusive findings on diagnostic imaging of breast: Secondary | ICD-10-CM | POA: Diagnosis not present

## 2024-05-09 LAB — CBC WITH DIFFERENTIAL (CANCER CENTER ONLY)
Abs Immature Granulocytes: 0.01 K/uL (ref 0.00–0.07)
Basophils Absolute: 0 K/uL (ref 0.0–0.1)
Basophils Relative: 1 %
Eosinophils Absolute: 0.1 K/uL (ref 0.0–0.5)
Eosinophils Relative: 2 %
HCT: 38.5 % (ref 36.0–46.0)
Hemoglobin: 12.8 g/dL (ref 12.0–15.0)
Immature Granulocytes: 0 %
Lymphocytes Relative: 29 %
Lymphs Abs: 1.7 K/uL (ref 0.7–4.0)
MCH: 31.2 pg (ref 26.0–34.0)
MCHC: 33.2 g/dL (ref 30.0–36.0)
MCV: 93.9 fL (ref 80.0–100.0)
Monocytes Absolute: 0.5 K/uL (ref 0.1–1.0)
Monocytes Relative: 8 %
Neutro Abs: 3.4 K/uL (ref 1.7–7.7)
Neutrophils Relative %: 60 %
Platelet Count: 249 K/uL (ref 150–400)
RBC: 4.1 MIL/uL (ref 3.87–5.11)
RDW: 12.8 % (ref 11.5–15.5)
WBC Count: 5.7 K/uL (ref 4.0–10.5)
nRBC: 0 % (ref 0.0–0.2)

## 2024-05-09 LAB — CMP (CANCER CENTER ONLY)
ALT: 24 U/L (ref 0–44)
AST: 24 U/L (ref 15–41)
Albumin: 3.6 g/dL (ref 3.5–5.0)
Alkaline Phosphatase: 66 U/L (ref 38–126)
Anion gap: 6 (ref 5–15)
BUN: 15 mg/dL (ref 8–23)
CO2: 28 mmol/L (ref 22–32)
Calcium: 9.1 mg/dL (ref 8.9–10.3)
Chloride: 108 mmol/L (ref 98–111)
Creatinine: 1.04 mg/dL — ABNORMAL HIGH (ref 0.44–1.00)
GFR, Estimated: 58 mL/min — ABNORMAL LOW (ref >60)
Glucose, Bld: 107 mg/dL — ABNORMAL HIGH (ref 70–99)
Potassium: 3.8 mmol/L (ref 3.5–5.1)
Sodium: 142 mmol/L (ref 135–145)
Total Bilirubin: 0.9 mg/dL (ref 0.0–1.2)
Total Protein: 6.4 g/dL — ABNORMAL LOW (ref 6.5–8.1)

## 2024-05-09 MED ORDER — ZOLEDRONIC ACID 4 MG/100ML IV SOLN
4.0000 mg | Freq: Once | INTRAVENOUS | Status: AC
Start: 2024-05-09 — End: 2024-05-09
  Administered 2024-05-09: 4 mg via INTRAVENOUS
  Filled 2024-05-09: qty 100

## 2024-05-09 MED ORDER — SODIUM CHLORIDE 0.9 % IV SOLN
INTRAVENOUS | Status: DC
  Filled 2024-05-09: qty 250

## 2024-05-09 MED ORDER — LETROZOLE 2.5 MG PO TABS: 2.5000 mg | ORAL_TABLET | Freq: Every day | ORAL | 1 refills | Status: AC

## 2024-05-09 NOTE — Assessment & Plan Note (Signed)
 Encourage fluid hydration and avoid nephrotoxins.  Recommend patient to follow-up with primary care provider and have kidney function rechecked

## 2024-05-09 NOTE — Assessment & Plan Note (Addendum)
#  High-grade DCIS, ER positive, status post lumpectomy, and adjuvant radiation. Continue letrozole  2.5 mg, plan for total 5 years, -till 08/2024. Annual mammogram -  June 2025 results reviewed with patient. asymmetry with 2 associated favored early dystrophic calcifications in the RIGHT lower outer breast, repeat diagnostic mammogram in 6 months.

## 2024-05-09 NOTE — Progress Notes (Signed)
 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 Continue letrozole  2.5 mg, plan for total 5 years, -till 08/2024. Annual mammogram -  June 2025 results reviewed with patient. asymmetry with 2 associated favored early dystrophic calcifications in the RIGHT lower outer breast, repeat diagnostic mammogram in 6 months.   Osteopenia continue calcium and vitamin D supplementation.  Recommend exercise as tolerated Zometa  4 mg every 6 months.  Proceed with Zometa  today 08/21/2021 Bone Density showed osteopenia, stable.  10/04/23 DEXA osteopenia   Elevated serum creatinine Encourage fluid hydration and avoid nephrotoxins.  Recommend patient to follow-up with primary care provider and have kidney function rechecked  Orders Placed This Encounter  Procedures   MM 3D DIAGNOSTIC MAMMOGRAM UNILATERAL RIGHT BREAST    Standing Status:   Future    Expected Date:   11/09/2024    Expiration Date:   05/09/2025    Reason for Exam (SYMPTOM  OR DIAGNOSIS REQUIRED):   DCIS right breast    Preferred imaging location?:   [facility]   US  LIMITED ULTRASOUND INCLUDING AXILLA RIGHT BREAST    Standing Status:   Future    Expected Date:   11/09/2024    Expiration Date:   05/09/2025    Reason for Exam (SYMPTOM  OR DIAGNOSIS REQUIRED):   DCIS right breast    Preferred Imaging [facility]?:    [facility]   CBC with Differential (Cancer Center Only)    Standing Status:   Future    Expected Date:   11/09/2024    Expiration Date:   02/07/2025   CMP (Cancer Center only)    Standing  Status:   Future    Expected Date:   11/09/2024    Expiration Date:   02/07/2025   Follow-up in 6 months. All questions were answered. The patient knows to call the clinic with any problems, questions or concerns.  Call Babara, MD, PhD [facility] Hematology Oncology 05/09/2024   HISTORY OF PRESENTING ILLNESS:  Jane Gardner is a  68 y.o.  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  Estrogen receptor status, >90%  Nipple discharge: Denies Family history: Mother had history of breast cancer and bladder cancer.  Alive.  Maternal aunt has pancreatic cancer.  Maternal uncle has stomach cancer.  Maternal uncle has lung cancer. OCP use: Previous use of OCP Estrogen and progesterone therapy: Menopause in 40s, she was on hormone replacement for a few years. History of radiation to chest: denies.  No previous breast biopsy.  # 05/15/2019 patient underwent right lumpectomy with sentinel lymph node biopsy. Pathology showed high-grade DCIS, 16mm, grade 3, posterior margin was positive for DCIS.  Regional lymph nodes uninvolved by tumor cells. pTis N0 05/24/2019 reexcision negative for DCIS.  08/09/2019 finished adjuvant radiation 08/23/2019 started on letrozole  2.5 mg   Family history of cancer, patient has family history of breast cancer, pancreatic cancer, stomach cancer. 7/6/2021[facility] genetic testing was negative 04/24/2022, bilateral screening mammogram showed asymmetry in the right breast.  Left breast no findings suspicious for  malignancy. 04/24/2022, bilateral screening mammogram showed asymmetry in the right breast.  Left breast no findings suspicious for malignancy.  05/21/2022 right diagnostic mammogram and ultrasound No mammographic evidence of malignancy.   INTERVAL HISTORY Jane Gardner is a 68 y.o. female who has above history reviewed by me today presents for follow up visit for management of DCIS Patient has been on Letrozole  2.5mg  daily patient tolerates well with manageable side effects.  + hot flash.  Denies any new breast concerns.     Review of Systems  Constitutional:  Negative for appetite change, chills, fatigue and fever.  HENT:   Negative for hearing loss and voice change.   Eyes:  Negative for eye problems.  Respiratory:  Negative for chest tightness and cough.   Cardiovascular:  Negative for chest pain.  Gastrointestinal:  Negative for abdominal distention, abdominal pain and blood in stool.  Endocrine: Positive for hot flashes.  Genitourinary:  Negative for difficulty urinating and frequency.   Musculoskeletal:  Positive for arthralgias.  Skin:  Negative for itching and rash.  Neurological:  Negative for extremity weakness.  Hematological:  Negative for adenopathy.  Psychiatric/Behavioral:  Negative for confusion.     MEDICAL HISTORY:  Past Medical History:  Diagnosis Date   Asthma    Breast cancer (HCC)    Ductal carcinoma in situ (DCIS) of right breast 06/08/2019   Family history of breast cancer    Family history of colon cancer    Family history of lung cancer    Family history of pancreatic cancer    Family history of stomach cancer    Fibrocystic breast    GERD (gastroesophageal reflux disease)    Hyperlipemia    Hypertension    Osteopenia 08/23/2019   Personal history of radiation therapy    Pneumonia     SURGICAL HISTORY: Past Surgical History:  Procedure Laterality Date   BREAST BIOPSY Right 05/02/2019   right breast stereo bx of calcs, coil clip, path  pending   BREAST CYST ASPIRATION     ? side neg   MASTECTOMY, PARTIAL Right 05/24/2019   Procedure: MASTECTOMY PARTIAL, REEXCISION OF RIGHT BREAST;  Surgeon: Rodolph Romano, MD;  Location: [facility] ORS;  Service: General;  Laterality: Right;   PARTIAL MASTECTOMY WITH NEEDLE LOCALIZATION Right 05/15/2019   Procedure: PARTIAL MASTECTOMY WITH NEEDLE LOCALIZATION AND SENTINEL NODE, RIGHT;  Surgeon: Rodolph Romano, MD;  Location: [facility] ORS;  Service: General;  Laterality: Right;   TONSILLECTOMY     TONSILLECTOMY AND ADENOIDECTOMY     TUBAL LIGATION      SOCIAL HISTORY: Social History   Socioeconomic History   Marital status: Married    Spouse name: Not on file   Number of children: Not on file   Years of education: Not on file   Highest education level: Not on file  Occupational History   Not on file  Tobacco Use   Smoking status: Former    Current packs/day: 0.00    Average packs/day: 1 pack/day for 3.0 years (3.0 ttl pk-yrs)    Types: Cigarettes    Start date: 27    Quit date: 83    Years since quitting: 48.5   Smokeless tobacco: Never  Vaping  Use   Vaping status: Never Used  Substance and Sexual Activity   Alcohol use: Yes    Comment: rare wine   Drug use: No   Sexual activity: Not on file  Other Topics Concern   Not on file  Social History Narrative   Not on file   Social Drivers of Health   Financial Resource Strain: Low Risk  (11/07/2023)   Received from [facility]   Overall Financial Resource Strain (CARDIA)    Difficulty of Paying Living Expenses: Not hard at all  Food Insecurity: No Food Insecurity (11/07/2023)   Received from [facility]   Hunger Vital Sign    Within the past 12 months, you worried that your food would run out before you got the money to buy more.: Never true    Within the past 12 months, the food you bought just didn't last and you didn't have money to get more.: Never true  Transportation Needs:  No Transportation Needs (11/07/2023)   Received from [facility] - Transportation    In the past 12 months, has lack of transportation kept you from medical appointments or from getting medications?: No    Lack of Transportation (Non-Medical): No  Physical Activity: Not on file  Stress: Not on file  Social Connections: Not on file  Intimate Partner Violence: Not on file    FAMILY HISTORY: Family History  Problem Relation Age of Onset   Hypertension Mother    Breast cancer Mother 69   Hyperlipidemia Mother    Bladder Cancer Mother 54   Rectal cancer Mother    Diabetes Father    Diabetes Paternal Grandmother    Pancreatic cancer Maternal Aunt        dx 104s   Stomach cancer Maternal Uncle        dx 76s   Lung cancer Maternal Uncle        both dx 92s   Colon cancer Cousin        dx 30s-40s   Breast cancer Cousin        dx 59s in Hospice    ALLERGIES:  is allergic to hyoscyamine sulfate, latex, and other.  MEDICATIONS:  Current Outpatient Medications  Medication Sig Dispense Refill   albuterol  (PROVENTIL  HFA;VENTOLIN  HFA) 108 (90 Base) MCG/ACT inhaler Inhale 1-2 puffs into the lungs every 6 (six) hours as needed for wheezing or shortness of breath. 1 Inhaler 0   Ascorbic Acid (VITAMIN C) 1000 MG tablet Take 1 tablet by mouth daily.     atorvastatin  (LIPITOR) 40 MG tablet Take 40 mg by mouth daily.     B Complex-C (SUPER B COMPLEX/VITAMIN C PO)      Biotin 5000 MCG CAPS Take by mouth.     Calcium Carbonate-Vit D-Min (CALCIUM 1200 PO) Take by mouth.     cetirizine (ZYRTEC) 10 MG tablet Take 10 mg by mouth daily.     Cholecalciferol (VITAMIN D3) 25 MCG (1000 UT) CAPS Take by mouth.     co-enzyme Q-10 50 MG capsule Take 100 mg by mouth daily.     Collagen-Vitamin C (COLLAGEN PLUS VITAMIN C PO)      famotidine  (PEPCID ) 20 MG tablet Take by mouth.     fluticasone  (FLONASE ) 50 MCG/ACT nasal spray Place 2 sprays into both nostrils daily. 16 g 2    lisinopril  (ZESTRIL ) 10 MG tablet Take 10 mg by mouth daily.     Misc Natural  Products (GLUCOSAMINE CHONDROITIN ADV PO) Take by mouth.     letrozole  (FEMARA ) 2.5 MG tablet Take 1 tablet (2.5 mg total) by mouth daily. 90 tablet 1   No current facility-administered medications for this visit.     PHYSICAL EXAMINATION: ECOG PERFORMANCE STATUS: 0 - Asymptomatic Vitals:   05/09/24 1313 05/09/24 1321  BP: (!) 155/94 (!) 138/97  Pulse: 68   Resp: 18   Temp: (!) 97.5 F (36.4 C)   SpO2: 99%    Filed Weights   05/09/24 1313  Weight: 217 lb 14.4 oz (98.8 kg)    Physical Exam Constitutional:      General: She is not in acute distress. HENT:     Head: Normocephalic and atraumatic.  Eyes:     General: No scleral icterus. Cardiovascular:     Rate and Rhythm: Normal rate.  Pulmonary:     Effort: Pulmonary effort is normal. No respiratory distress.     Breath sounds: No wheezing.  Abdominal:     General: Bowel sounds are normal. There is no distension.     Palpations: Abdomen is soft. There is no mass.     Tenderness: There is no abdominal tenderness.  Musculoskeletal:        General: Normal range of motion.     Cervical back: Normal range of motion and neck supple.     Comments: Left lower extremity edema which is chronic  Skin:    General: Skin is warm.     Findings: No rash.  Neurological:     Mental Status: She is alert and oriented to person, place, and time. Mental status is at baseline.     Cranial Nerves: No cranial nerve deficit.  Psychiatric:        Mood and Affect: Mood normal.      LABORATORY DATA:  I have reviewed the data as listed Lab Results  Component Value Date   WBC 5.7 05/09/2024   HGB 12.8 05/09/2024   HCT 38.5 05/09/2024   MCV 93.9 05/09/2024   PLT 249 05/09/2024   Recent Labs    11/10/23 1317 05/09/24 1256  NA 141 142  K 3.5 3.8  CL 107 108  CO2 27 28  GLUCOSE 103* 107*  BUN 15 15  CREATININE 0.72 1.04*  CALCIUM 9.1 9.1  GFRNONAA  >60 58*  PROT 6.5 6.4*  ALBUMIN 3.7 3.6  AST 38 24  ALT 35 24  ALKPHOS 59 66  BILITOT 0.9 0.9   RADIOGRAPHIC STUDIES: I have personally reviewed the radiological images as listed and agreed with the findings in the report. MM 3D DIAGNOSTIC MAMMOGRAM UNILATERAL RIGHT BREAST Result Date: 05/03/2024 CLINICAL DATA:  History of a RIGHT lumpectomy 2020 with re-excision after positive margins EXAM: DIGITAL DIAGNOSTIC UNILATERAL RIGHT MAMMOGRAM WITH TOMOSYNTHESIS AND CAD; ULTRASOUND RIGHT BREAST LIMITED TECHNIQUE: Right digital diagnostic mammography and breast tomosynthesis was performed. The images were evaluated with computer-aided detection. ; Targeted ultrasound examination of the right breast was performed COMPARISON:  Previous exam(s). ACR Breast Density Category b: There are scattered areas of fibroglandular density. FINDINGS: Spot compression tomosynthesis views demonstrates a persistent asymmetry in the RIGHT lower outer breast at middle to posterior depth. There is a suggestion of internal fat density noted on several of the tomosynthesis images. It demonstrates 2 associated round calcifications with favored central lucency and a possible internal locule of fat noted on spot magnification view. On physical exam, no suspicious mass is appreciated. Targeted ultrasound was performed of the RIGHT upper  outer breast. A definitive correlate for this asymmetry is not identified. There is incidental note of a benign oil cyst at 8 o'clock 4 cm from the nipple. IMPRESSION: 1. There is a probably benign asymmetry with 2 associated favored early dystrophic calcifications in the RIGHT lower outer breast which is favored to reflect evolving fat necrosis status post 2 RIGHT breast surgeries. Option for short-term follow-up versus definitive characterization with stereotactic biopsy was discussed with patient. Patient would prefer to proceed with short-term follow-up at this point in time. As such, recommend RIGHT  diagnostic mammogram in 6 months. This will establish 6 months of definitive stability/appropriate evolution RECOMMENDATION: RIGHT diagnostic mammogram (with RIGHT breast ultrasound if deemed necessary) in 6 months. I have discussed the findings and recommendations with the patient. If applicable, a reminder letter will be sent to the patient regarding the next appointment. BI-RADS CATEGORY  3: Probably benign. Electronically Signed   By: Corean Salter M.D.   On: 05/03/2024 14:47   US  LIMITED ULTRASOUND INCLUDING AXILLA RIGHT BREAST Result Date: 05/03/2024 CLINICAL DATA:  History of a RIGHT lumpectomy 2020 with re-excision after positive margins EXAM: DIGITAL DIAGNOSTIC UNILATERAL RIGHT MAMMOGRAM WITH TOMOSYNTHESIS AND CAD; ULTRASOUND RIGHT BREAST LIMITED TECHNIQUE: Right digital diagnostic mammography and breast tomosynthesis was performed. The images were evaluated with computer-aided detection. ; Targeted ultrasound examination of the right breast was performed COMPARISON:  Previous exam(s). ACR Breast Density Category b: There are scattered areas of fibroglandular density. FINDINGS: Spot compression tomosynthesis views demonstrates a persistent asymmetry in the RIGHT lower outer breast at middle to posterior depth. There is a suggestion of internal fat density noted on several of the tomosynthesis images. It demonstrates 2 associated round calcifications with favored central lucency and a possible internal locule of fat noted on spot magnification view. On physical exam, no suspicious mass is appreciated. Targeted ultrasound was performed of the RIGHT upper outer breast. A definitive correlate for this asymmetry is not identified. There is incidental note of a benign oil cyst at 8 o'clock 4 cm from the nipple. IMPRESSION: 1. There is a probably benign asymmetry with 2 associated favored early dystrophic calcifications in the RIGHT lower outer breast which is favored to reflect evolving fat necrosis status  post 2 RIGHT breast surgeries. Option for short-term follow-up versus definitive characterization with stereotactic biopsy was discussed with patient. Patient would prefer to proceed with short-term follow-up at this point in time. As such, recommend RIGHT diagnostic mammogram in 6 months. This will establish 6 months of definitive stability/appropriate evolution RECOMMENDATION: RIGHT diagnostic mammogram (with RIGHT breast ultrasound if deemed necessary) in 6 months. I have discussed the findings and recommendations with the patient. If applicable, a reminder letter will be sent to the patient regarding the next appointment. BI-RADS CATEGORY  3: Probably benign. Electronically Signed   By: Corean Salter M.D.   On: 05/03/2024 14:47   MM 3D SCREENING MAMMOGRAM BILATERAL BREAST Result Date: 04/28/2024 CLINICAL DATA:  Screening. EXAM: DIGITAL SCREENING BILATERAL MAMMOGRAM WITH TOMOSYNTHESIS AND CAD TECHNIQUE: Bilateral screening digital craniocaudal and mediolateral oblique mammograms were obtained. Bilateral screening digital breast tomosynthesis was performed. The images were evaluated with computer-aided detection. COMPARISON:  Previous exam(s). ACR Breast Density Category b: There are scattered areas of fibroglandular density. FINDINGS: In the right breast, a possible asymmetry with calcifications warrants further evaluation. In the left breast, no findings suspicious for malignancy. IMPRESSION: Further evaluation is suggested for possible asymmetry with calcifications in the right breast. RECOMMENDATION: Diagnostic mammogram and possibly ultrasound of  the right breast. (Code:[id number]) The patient will be contacted regarding the findings, and additional imaging will be scheduled. BI-RADS CATEGORY  0: Incomplete: Need additional imaging evaluation. Electronically Signed   By: Norleen Croak M.D.   On: 04/28/2024 11:39

## 2024-05-09 NOTE — Assessment & Plan Note (Signed)
 continue calcium and vitamin D supplementation.  Recommend exercise as tolerated Zometa 4 mg every 6 months.  Proceed with Zometa today 08/21/2021 Bone Density showed osteopenia, stable.  10/04/23 DEXA osteopenia

## 2024-05-09 NOTE — Patient Instructions (Signed)

## 2024-11-06 ENCOUNTER — Ambulatory Visit: Payer: Self-pay | Admitting: Oncology

## 2024-11-06 ENCOUNTER — Ambulatory Visit
Admission: RE | Admit: 2024-11-06 | Discharge: 2024-11-06 | Disposition: A | Discharge status: Home / Self Care | Source: Ambulatory Visit | Attending: Oncology | Admitting: Oncology

## 2024-11-06 ENCOUNTER — Encounter: Payer: Self-pay | Admitting: Oncology

## 2024-11-06 DIAGNOSIS — R928 Other abnormal and inconclusive findings on diagnostic imaging of breast: Secondary | ICD-10-CM | POA: Insufficient documentation

## 2024-11-07 DIAGNOSIS — D0511 Intraductal carcinoma in situ of right breast: Secondary | ICD-10-CM

## 2024-11-07 NOTE — Progress Notes (Signed)
 Pt informed. She is scheduled to see Dr. Babara on 1/8.

## 2024-11-09 ENCOUNTER — Encounter: Payer: Self-pay | Admitting: Oncology

## 2024-11-09 ENCOUNTER — Inpatient Hospital Stay

## 2024-11-09 ENCOUNTER — Inpatient Hospital Stay: Admitting: Oncology

## 2024-11-09 ENCOUNTER — Inpatient Hospital Stay: Attending: Oncology

## 2024-11-09 VITALS — BP 140/86 | HR 86 | Temp 98.0°F | Resp 20 | Wt 224.8 lb

## 2024-11-09 DIAGNOSIS — D0511 Intraductal carcinoma in situ of right breast: Secondary | ICD-10-CM | POA: Diagnosis not present

## 2024-11-09 DIAGNOSIS — M8580 Other specified disorders of bone density and structure, unspecified site: Secondary | ICD-10-CM

## 2024-11-09 LAB — CMP (CANCER CENTER ONLY)
ALT: 28 U/L (ref 0–44)
AST: 25 U/L (ref 15–41)
Albumin: 4.1 g/dL (ref 3.5–5.0)
Alkaline Phosphatase: 89 U/L (ref 38–126)
Anion gap: 10 (ref 5–15)
BUN: 16 mg/dL (ref 8–23)
CO2: 27 mmol/L (ref 22–32)
Calcium: 9.9 mg/dL (ref 8.9–10.3)
Chloride: 106 mmol/L (ref 98–111)
Creatinine: 0.91 mg/dL (ref 0.44–1.00)
GFR, Estimated: >60 mL/min
Glucose, Bld: 135 mg/dL — ABNORMAL HIGH (ref 70–99)
Potassium: 4.1 mmol/L (ref 3.5–5.1)
Sodium: 143 mmol/L (ref 135–145)
Total Bilirubin: 0.5 mg/dL (ref 0.0–1.2)
Total Protein: 6.6 g/dL (ref 6.5–8.1)

## 2024-11-09 LAB — CBC WITH DIFFERENTIAL (CANCER CENTER ONLY)
Abs Immature Granulocytes: 0.02 K/uL (ref 0.00–0.07)
Basophils Absolute: 0 K/uL (ref 0.0–0.1)
Basophils Relative: 0 %
Eosinophils Absolute: 0.1 K/uL (ref 0.0–0.5)
Eosinophils Relative: 2 %
HCT: 41.8 % (ref 36.0–46.0)
Hemoglobin: 13.8 g/dL (ref 12.0–15.0)
Immature Granulocytes: 0 %
Lymphocytes Relative: 30 %
Lymphs Abs: 1.7 K/uL (ref 0.7–4.0)
MCH: 31.2 pg (ref 26.0–34.0)
MCHC: 33 g/dL (ref 30.0–36.0)
MCV: 94.4 fL (ref 80.0–100.0)
Monocytes Absolute: 0.4 K/uL (ref 0.1–1.0)
Monocytes Relative: 6 %
Neutro Abs: 3.6 K/uL (ref 1.7–7.7)
Neutrophils Relative %: 62 %
Platelet Count: 257 K/uL (ref 150–400)
RBC: 4.43 MIL/uL (ref 3.87–5.11)
RDW: 12.6 % (ref 11.5–15.5)
WBC Count: 5.8 K/uL (ref 4.0–10.5)
nRBC: 0 % (ref 0.0–0.2)

## 2024-11-09 MED ORDER — ZOLEDRONIC ACID 4 MG/100ML IV SOLN
4.0000 mg | Freq: Once | INTRAVENOUS | Status: AC
  Administered 2024-11-09: 4 mg via INTRAVENOUS
  Filled 2024-11-09: qty 100

## 2024-11-09 NOTE — Assessment & Plan Note (Signed)
 continue calcium and vitamin D supplementation.  Recommend exercise as tolerated Zometa  4 mg every 6 months.  Proceed with Zometa  today 08/21/2021 Bone Density showed osteopenia, stable.  10/04/23 DEXA osteopenia repeat DEXA in December 2026.  If no further decrease of bone density, given that she has finished endocrine therapy, consider stopping Zometa  at that time.SABRA

## 2024-11-09 NOTE — Patient Instructions (Signed)

## 2024-11-09 NOTE — Progress Notes (Signed)
 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 finished  total 5 years letrozole  in end of Nov 2025. Annual mammogram -January 2026 results reviewed with patient. No significant interval change in size or appearance of RIGHT breast focal asymmetry with calcifications repeat diagnostic mammogram in 6 months.   Osteopenia continue calcium and vitamin D supplementation.  Recommend exercise as tolerated Zometa  4 mg every 6 months.  Proceed with Zometa  today 08/21/2021 Bone Density showed osteopenia, stable.  10/04/23 DEXA osteopenia repeat DEXA in December 2026.  If no further decrease of bone density, given that she has finished endocrine therapy, consider stopping Zometa  at that time..  Orders Placed This Encounter  Procedures   DG Bone Density    Standing Status:   Future    Expected Date:   10/09/2025    Expiration Date:   01/07/2026    Reason for Exam (SYMPTOM  OR DIAGNOSIS REQUIRED):   Hx breast cancer    Preferred imaging location?:   [facility]   CMP (Cancer Center only)    Standing Status:   Future    Expected Date:   11/09/2025    Expiration Date:   02/07/2026   CBC with Differential (Cancer Center Only)    Standing Status:   Future    Expected Date:   11/09/2025    Expiration Date:   02/07/2026   Basic Metabolic Panel - Cancer Center Only    Standing Status:   Future    Expected Date:   05/09/2025    Expiration Date:   08/07/2025   Follow-up in 6 months. All questions were answered. The patient knows to call the clinic with any problems,  questions or concerns.  Call Babara, MD, PhD [facility] Hematology Oncology 11/09/2024   HISTORY OF PRESENTING ILLNESS:  Jane Gardner is a  69 y.o.  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  Estrogen receptor status, >90%  Nipple discharge: Denies Family history: Mother had history of breast cancer and bladder cancer.  Alive.  Maternal aunt has pancreatic cancer.  Maternal uncle has stomach cancer.  Maternal uncle has lung cancer. OCP use: Previous use of OCP Estrogen and progesterone therapy: Menopause in 40s, she was on hormone replacement for a few years. History of radiation to chest: denies.  No previous breast biopsy.  # 05/15/2019 patient underwent right lumpectomy with sentinel lymph node biopsy. Pathology showed high-grade DCIS, 16mm, grade 3, posterior margin was positive for DCIS.  Regional lymph nodes uninvolved by tumor cells. pTis N0 05/24/2019 reexcision negative for DCIS. 08/09/2019 finished adjuvant radiation 08/23/2019 started on letrozole  2.5 mg   Family history of cancer, patient has family history of breast cancer, pancreatic cancer, stomach cancer. 7/6/2021[facility] genetic  testing was negative 04/24/2022, bilateral screening mammogram showed asymmetry in the right breast.  Left breast no findings suspicious for malignancy. 04/24/2022, bilateral screening mammogram showed asymmetry in the right breast.  Left breast no findings suspicious for malignancy.  05/21/2022 right diagnostic mammogram and ultrasound No  mammographic evidence of malignancy.   INTERVAL HISTORY Jane Gardner is a 69 y.o. female who has above history reviewed by me today presents for follow up visit for management of DCIS She has finished Letrozole , off treatment since November 2025. Denies any new breast concerns.     Review of Systems  Constitutional:  Negative for appetite change, chills, fatigue and fever.  HENT:   Negative for hearing loss and voice change.   Eyes:  Negative for eye problems.  Respiratory:  Negative for chest tightness and cough.   Cardiovascular:  Negative for chest pain.  Gastrointestinal:  Negative for abdominal distention, abdominal pain and blood in stool.  Genitourinary:  Negative for difficulty urinating and frequency.   Musculoskeletal:  Positive for arthralgias.  Skin:  Negative for itching and rash.  Neurological:  Negative for extremity weakness.  Hematological:  Negative for adenopathy.  Psychiatric/Behavioral:  Negative for confusion.     MEDICAL HISTORY:  Past Medical History:  Diagnosis Date   Asthma    Breast cancer (HCC)    Ductal carcinoma in situ (DCIS) of right breast 06/08/2019   Family history of breast cancer    Family history of colon cancer    Family history of lung cancer    Family history of pancreatic cancer    Family history of stomach cancer    Fibrocystic breast    GERD (gastroesophageal reflux disease)    Hyperlipemia    Hypertension    Osteopenia 08/23/2019   Personal history of radiation therapy    Pneumonia     SURGICAL HISTORY: Past Surgical History:  Procedure Laterality Date   BREAST BIOPSY Right 05/02/2019   right breast stereo bx of calcs, coil clip, path pending   BREAST CYST ASPIRATION     ? side neg   MASTECTOMY, PARTIAL Right 05/24/2019   Procedure: MASTECTOMY PARTIAL, REEXCISION OF RIGHT BREAST;  Surgeon: Rodolph Romano, MD;  Location: [facility] ORS;  Service: General;  Laterality: Right;   PARTIAL MASTECTOMY WITH NEEDLE  LOCALIZATION Right 05/15/2019   Procedure: PARTIAL MASTECTOMY WITH NEEDLE LOCALIZATION AND SENTINEL NODE, RIGHT;  Surgeon: Rodolph Romano, MD;  Location: [facility] ORS;  Service: General;  Laterality: Right;   TONSILLECTOMY     TONSILLECTOMY AND ADENOIDECTOMY     TUBAL LIGATION      SOCIAL HISTORY: Social History   Socioeconomic History   Marital status: Married    Spouse name: Not on file   Number of children: Not on file   Years of education: Not on file   Highest education level: Not on file  Occupational History   Not on file  Tobacco Use   Smoking status: Former    Current packs/day: 0.00    Average packs/day: 1 pack/day for 3.0 years (3.0 ttl pk-yrs)    Types: Cigarettes    Start date: 31    Quit date: 13    Years since quitting: 49.0   Smokeless tobacco: Never  Vaping Use   Vaping status: Never Used  Substance and Sexual Activity   Alcohol use: Yes    Comment: rare wine   Drug use: No   Sexual activity: Not on file  Other Topics Concern   Not on file  Social History Narrative   Not on file   Social Drivers of Health   Tobacco Use: Medium Risk (11/09/2024)   Patient History    Smoking Tobacco Use: Former    Smokeless Tobacco Use: Never    Passive Exposure: Not on file  Financial Resource Strain: Low Risk  (11/05/2024)   Received from [facility]   Overall Financial Resource Strain (CARDIA)    Difficulty of Paying Living Expenses: Not hard at all  Food Insecurity: No Food Insecurity (11/05/2024)   Received from [facility]   Epic    Within the past 12 months, you worried that your food would run out before you got the money to buy more.: Never true    Within the past 12 months, the food you bought just didn't last and you didn't have money to get more.: Never true  Transportation Needs: No Transportation Needs (11/05/2024)   Received from [facility]    In the past 12 months,  has lack of transportation kept you from medical appointments or from getting medications?: No    Lack of Transportation (Non-Medical): No  Physical Activity: Not on file  Stress: Not on file  Social Connections: Not on file  Intimate Partner Violence: Not on file  Depression (EYV7-0): Not on file  Alcohol Screen: Not on file  Housing: Low Risk  (11/08/2024)   Received from [facility]   Epic    In the last 12 months, was there a time when you were not able to pay the mortgage or rent on time?: No    In the past 12 months, how many times have you moved where you were living?: 0    At any time in the past 12 months, were you homeless or living in a shelter (including now)?: No  Utilities: Not At Risk (11/05/2024)   Received from [facility]   Epic    In the past 12 months has the electric, gas, oil, or water company threatened to shut off services in your home?: No  Health Literacy: Not on file    FAMILY HISTORY: Family History  Problem Relation Age of Onset   Hypertension Mother    Breast cancer Mother 23   Hyperlipidemia Mother    Bladder Cancer Mother 40   Rectal cancer Mother    Diabetes Father    Diabetes Paternal Grandmother    Pancreatic cancer Maternal Aunt        dx 30s   Stomach cancer Maternal Uncle        dx 77s   Lung cancer Maternal Uncle        both dx 36s   Colon cancer Cousin        dx 30s-40s   Breast cancer Cousin        dx 87s in Hospice    ALLERGIES:  is allergic to hyoscyamine sulfate, latex, and other.  MEDICATIONS:  Current Outpatient Medications  Medication Sig Dispense Refill   albuterol  (PROVENTIL  HFA;VENTOLIN  HFA) 108 (90 Base) MCG/ACT inhaler Inhale 1-2 puffs into the lungs every 6 (six) hours as needed for wheezing or shortness of breath. 1 Inhaler 0   Ascorbic Acid (VITAMIN C) 1000 MG tablet Take 1 tablet by mouth daily.     atorvastatin (LIPITOR) 40 MG tablet Take 40 mg by mouth daily.     B Complex-C  (SUPER B COMPLEX/VITAMIN C PO)  Biotin 5000 MCG CAPS Take by mouth.     Calcium Carbonate-Vit D-Min (CALCIUM 1200 PO) Take by mouth.     cetirizine (ZYRTEC) 10 MG tablet Take 10 mg by mouth daily.     Cholecalciferol (VITAMIN D3) 25 MCG (1000 UT) CAPS Take by mouth.     co-enzyme Q-10 50 MG capsule Take 100 mg by mouth daily.     Collagen-Vitamin C (COLLAGEN PLUS VITAMIN C PO)      famotidine  (PEPCID ) 20 MG tablet Take by mouth.     lisinopril (ZESTRIL) 10 MG tablet Take 10 mg by mouth daily.     Misc Natural Products (GLUCOSAMINE CHONDROITIN ADV PO) Take by mouth.     fluticasone  (FLONASE ) 50 MCG/ACT nasal spray Place 2 sprays into both nostrils daily. (Patient not taking: Reported on 11/09/2024) 16 g 2   letrozole  (FEMARA ) 2.5 MG tablet Take 1 tablet (2.5 mg total) by mouth daily. (Patient not taking: Reported on 11/09/2024) 90 tablet 1   No current facility-administered medications for this visit.     PHYSICAL EXAMINATION: ECOG PERFORMANCE STATUS: 0 - Asymptomatic Vitals:   11/09/24 1342  BP: (!) 140/86  Pulse: 86  Resp: 20  Temp: 98 F (36.7 C)  SpO2: 100%   Filed Weights   11/09/24 1342  Weight: 224 lb 12.8 oz (102 kg)    Physical Exam Constitutional:      General: She is not in acute distress. HENT:     Head: Normocephalic and atraumatic.  Eyes:     General: No scleral icterus. Cardiovascular:     Rate and Rhythm: Normal rate.  Pulmonary:     Effort: Pulmonary effort is normal. No respiratory distress.     Breath sounds: No wheezing.  Abdominal:     General: Bowel sounds are normal. There is no distension.     Palpations: Abdomen is soft. There is no mass.     Tenderness: There is no abdominal tenderness.  Musculoskeletal:        General: Normal range of motion.     Cervical back: Normal range of motion and neck supple.     Comments: Left lower extremity edema which is chronic  Skin:    General: Skin is warm.     Findings: No rash.  Neurological:      Mental Status: She is alert and oriented to person, place, and time. Mental status is at baseline.     Cranial Nerves: No cranial nerve deficit.  Psychiatric:        Mood and Affect: Mood normal.      LABORATORY DATA:  I have reviewed the data as listed Lab Results  Component Value Date   WBC 5.8 11/09/2024   HGB 13.8 11/09/2024   HCT 41.8 11/09/2024   MCV 94.4 11/09/2024   PLT 257 11/09/2024   Recent Labs    05/09/24 1256 11/09/24 1330  NA 142 143  K 3.8 4.1  CL 108 106  CO2 28 27  GLUCOSE 107* 135*  BUN 15 16  CREATININE 1.04* 0.91  CALCIUM 9.1 9.9  GFRNONAA 58* >60  PROT 6.4* 6.6  ALBUMIN 3.6 4.1  AST 24 25  ALT 24 28  ALKPHOS 66 89  BILITOT 0.9 0.5   RADIOGRAPHIC STUDIES: I have personally reviewed the radiological images as listed and agreed with the findings in the report. MM 3D DIAGNOSTIC MAMMOGRAM UNILATERAL RIGHT BREAST Result Date: 11/06/2024 CLINICAL DATA:  History of RIGHT breast lumpectomy in 2020 with re-excision after  positive margins. She was recalled from screening in June 2025 with diagnostic workup in July of 2025 showing an asymmetry in the lower outer RIGHT breast with favored early coarse calcifications and possible internal fat density. EXAM: DIGITAL DIAGNOSTIC UNILATERAL RIGHT MAMMOGRAM WITH TOMOSYNTHESIS AND CAD TECHNIQUE: Right digital diagnostic mammography and breast tomosynthesis was performed. The images were evaluated with computer-aided detection. COMPARISON:  Previous exam(s). ACR Breast Density Category b: There are scattered areas of fibroglandular density. FINDINGS: Stable mammographic appearance of a probably benign RIGHT breast focal asymmetry with early coarse calcifications in the RIGHT breast, which had possible internal fat density on the prior diagnostic mammogram and was favored to reflect evolving fat necrosis in the setting of multiple prior RIGHT breast surgeries. No new suspicious findings in the RIGHT breast. Stable appearance  of the RIGHT breast lumpectomy site. IMPRESSION: No significant interval change in size or appearance of RIGHT breast focal asymmetry with calcifications which was previously described as probably benign. RECOMMENDATION: BILATERAL diagnostic mammogram in six months (with BILATERAL ultrasound if deemed necessary by the radiologist). I have discussed the findings and recommendations with the patient. If applicable, a reminder letter will be sent to the patient regarding the next appointment. BI-RADS CATEGORY  3: Probably benign. Electronically Signed   By: Norleen Croak M.D.   On: 11/06/2024 11:32   "

## 2024-11-09 NOTE — Assessment & Plan Note (Addendum)
#  High-grade DCIS, ER positive, status post lumpectomy, and adjuvant radiation. finished  total 5 years letrozole  in end of Nov 2025. Annual mammogram -January 2026 results reviewed with patient. No significant interval change in size or appearance of RIGHT breast focal asymmetry with calcifications repeat diagnostic mammogram in 6 months.

## 2025-05-10 ENCOUNTER — Inpatient Hospital Stay

## 2025-11-09 ENCOUNTER — Inpatient Hospital Stay

## 2025-11-09 ENCOUNTER — Inpatient Hospital Stay: Admitting: Oncology
